# Patient Record
Sex: Male | Born: 1965 | Race: White | Hispanic: No | Marital: Single | Smoking: Never smoker
Health system: Southern US, Community
[De-identification: ages and names within clinical notes are randomized; demographics above are authoritative.]

## PROBLEM LIST (undated history)

## (undated) DIAGNOSIS — K7290 Hepatic failure, unspecified without coma: Secondary | ICD-10-CM

## (undated) DIAGNOSIS — K7010 Alcoholic hepatitis without ascites: Secondary | ICD-10-CM

## (undated) DIAGNOSIS — M109 Gout, unspecified: Secondary | ICD-10-CM

## (undated) DIAGNOSIS — K819 Cholecystitis, unspecified: Secondary | ICD-10-CM

## (undated) DIAGNOSIS — E871 Hypo-osmolality and hyponatremia: Secondary | ICD-10-CM

## (undated) DIAGNOSIS — F1020 Alcohol dependence, uncomplicated: Secondary | ICD-10-CM

## (undated) HISTORY — PX: APPENDECTOMY: SHX54

## (undated) HISTORY — PX: CHEST SURGERY: SHX595

---

## 2000-07-09 ENCOUNTER — Encounter: Payer: Self-pay | Admitting: Surgery

## 2000-07-09 ENCOUNTER — Inpatient Hospital Stay (HOSPITAL_COMMUNITY): Admission: EM | Admit: 2000-07-09 | Discharge: 2000-07-29 | Payer: Self-pay

## 2000-07-09 ENCOUNTER — Encounter: Payer: Self-pay | Admitting: Emergency Medicine

## 2000-07-10 ENCOUNTER — Encounter: Payer: Self-pay | Admitting: Surgery

## 2000-07-11 ENCOUNTER — Encounter: Payer: Self-pay | Admitting: General Surgery

## 2000-07-13 ENCOUNTER — Encounter: Payer: Self-pay | Admitting: General Surgery

## 2000-07-14 ENCOUNTER — Encounter: Payer: Self-pay | Admitting: General Surgery

## 2000-07-14 ENCOUNTER — Encounter: Payer: Self-pay | Admitting: Surgery

## 2000-07-16 ENCOUNTER — Encounter: Payer: Self-pay | Admitting: General Surgery

## 2000-07-17 ENCOUNTER — Encounter: Payer: Self-pay | Admitting: General Surgery

## 2000-07-18 ENCOUNTER — Encounter: Payer: Self-pay | Admitting: General Surgery

## 2000-07-19 ENCOUNTER — Encounter: Payer: Self-pay | Admitting: General Surgery

## 2000-07-20 ENCOUNTER — Encounter: Payer: Self-pay | Admitting: Cardiothoracic Surgery

## 2000-07-20 ENCOUNTER — Encounter: Payer: Self-pay | Admitting: General Surgery

## 2000-07-24 ENCOUNTER — Encounter: Payer: Self-pay | Admitting: Cardiothoracic Surgery

## 2000-07-25 ENCOUNTER — Encounter: Payer: Self-pay | Admitting: General Surgery

## 2000-07-26 ENCOUNTER — Encounter: Payer: Self-pay | Admitting: General Surgery

## 2000-07-27 ENCOUNTER — Encounter: Payer: Self-pay | Admitting: Thoracic Surgery (Cardiothoracic Vascular Surgery)

## 2000-07-27 ENCOUNTER — Encounter: Payer: Self-pay | Admitting: General Surgery

## 2000-08-15 ENCOUNTER — Encounter: Admission: RE | Admit: 2000-08-15 | Discharge: 2000-08-15 | Payer: Self-pay | Admitting: Cardiothoracic Surgery

## 2000-08-15 ENCOUNTER — Encounter: Payer: Self-pay | Admitting: Cardiothoracic Surgery

## 2001-02-17 ENCOUNTER — Emergency Department (HOSPITAL_COMMUNITY): Admission: EM | Admit: 2001-02-17 | Discharge: 2001-02-18 | Payer: Self-pay | Admitting: Emergency Medicine

## 2001-06-08 ENCOUNTER — Emergency Department (HOSPITAL_COMMUNITY): Admission: EM | Admit: 2001-06-08 | Discharge: 2001-06-08 | Payer: Self-pay

## 2002-12-30 ENCOUNTER — Emergency Department (HOSPITAL_COMMUNITY): Admission: EM | Admit: 2002-12-30 | Discharge: 2002-12-30 | Payer: Self-pay | Admitting: Emergency Medicine

## 2002-12-30 ENCOUNTER — Encounter: Payer: Self-pay | Admitting: Emergency Medicine

## 2002-12-30 ENCOUNTER — Ambulatory Visit (HOSPITAL_COMMUNITY): Admission: RE | Admit: 2002-12-30 | Discharge: 2002-12-30 | Payer: Self-pay | Admitting: Emergency Medicine

## 2004-04-20 ENCOUNTER — Emergency Department (HOSPITAL_COMMUNITY): Admission: EM | Admit: 2004-04-20 | Discharge: 2004-04-20 | Payer: Self-pay | Admitting: Emergency Medicine

## 2006-02-24 ENCOUNTER — Emergency Department (HOSPITAL_COMMUNITY): Admission: EM | Admit: 2006-02-24 | Discharge: 2006-02-24 | Payer: Self-pay | Admitting: Emergency Medicine

## 2006-06-09 ENCOUNTER — Emergency Department (HOSPITAL_COMMUNITY): Admission: EM | Admit: 2006-06-09 | Discharge: 2006-06-09 | Payer: Self-pay | Admitting: Emergency Medicine

## 2008-02-11 ENCOUNTER — Emergency Department (HOSPITAL_COMMUNITY): Admission: EM | Admit: 2008-02-11 | Discharge: 2008-02-11 | Payer: Self-pay | Admitting: Emergency Medicine

## 2008-07-30 ENCOUNTER — Emergency Department (HOSPITAL_COMMUNITY): Admission: EM | Admit: 2008-07-30 | Discharge: 2008-07-30 | Payer: Self-pay | Admitting: Emergency Medicine
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
## 2011-12-07 ENCOUNTER — Encounter (HOSPITAL_COMMUNITY): Payer: Self-pay | Admitting: Emergency Medicine

## 2011-12-07 ENCOUNTER — Emergency Department (HOSPITAL_COMMUNITY)
Admission: EM | Admit: 2011-12-07 | Discharge: 2011-12-08 | Disposition: A | Discharge status: Home / Self Care | Payer: Self-pay | Attending: Emergency Medicine | Admitting: Emergency Medicine

## 2011-12-07 ENCOUNTER — Emergency Department (HOSPITAL_COMMUNITY): Payer: Self-pay

## 2011-12-07 DIAGNOSIS — M109 Gout, unspecified: Secondary | ICD-10-CM | POA: Insufficient documentation

## 2011-12-07 DIAGNOSIS — M25579 Pain in unspecified ankle and joints of unspecified foot: Secondary | ICD-10-CM | POA: Insufficient documentation

## 2011-12-07 DIAGNOSIS — M7989 Other specified soft tissue disorders: Secondary | ICD-10-CM | POA: Insufficient documentation

## 2011-12-07 LAB — POCT I-STAT, CHEM 8
BUN: 11 mg/dL (ref 6–23)
Chloride: 108 mEq/L (ref 96–112)
Creatinine, Ser: 1.3 mg/dL (ref 0.50–1.35)
Glucose, Bld: 122 mg/dL — ABNORMAL HIGH (ref 70–99)
Hemoglobin: 16.7 g/dL (ref 13.0–17.0)
Potassium: 3.9 mEq/L (ref 3.5–5.1)
Sodium: 138 mEq/L (ref 135–145)

## 2011-12-07 MED ORDER — OXYCODONE-ACETAMINOPHEN 5-325 MG PO TABS
2.0000 | ORAL_TABLET | Freq: Once | ORAL | Status: AC
  Administered 2011-12-07: 2 via ORAL
  Filled 2011-12-07: qty 2
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
## 2011-12-07 NOTE — ED Notes (Signed)
Pt states he fell off steps on Friday.  Had knee pain for past 2 days.  States starting this am, having rt ankle pain.  No trauma noted to ankle.  States not taking any pain meds at home including ibuprofen/tylenol.  Pain is throbbing.  Rt ankle does appear swollen.

## 2011-12-07 NOTE — ED Notes (Signed)
Pt c/o R ankle pain, R knee pain yesterday. Denies trauma.

## 2011-12-08 MED ORDER — HYDROCODONE-ACETAMINOPHEN 5-325 MG PO TABS: 1.0000 | ORAL_TABLET | ORAL | Status: AC | PRN

## 2011-12-08 MED ORDER — COLCHICINE 0.6 MG PO TABS: 0.6000 mg | ORAL_TABLET | Freq: Every day | ORAL | Status: DC

## 2011-12-08 MED ORDER — INDOMETHACIN 25 MG PO CAPS: 25.0000 mg | ORAL_CAPSULE | Freq: Three times a day (TID) | ORAL | Status: AC | PRN

## 2011-12-08 NOTE — Discharge Instructions (Signed)
You were seen and evaluated today for your complaints of right ankle and foot pains. This time your x-rays do not show any broken bones or other concerning findings. Your lab tests to show signs concerning for possible gout flareup. He is given prescriptions for medicines to use for this. It is also recommended to use rest, ice, compression and elevation to reduce pain and swelling in your foot and ankle. Please followup to primary care provider for continued evaluation and treatment of your symptoms. Please read the attached information regarding your condition and dietary changes.  Gout Gout is an inflammatory condition (arthritis) caused by a buildup of uric acid crystals in the joints. Uric acid is a chemical that is normally present in the blood. Under some circumstances, uric acid can form into crystals in your joints. This causes joint redness, soreness, and swelling (inflammation). Repeat attacks are common. Over time, uric acid crystals can form into masses (tophi) near a joint, causing disfigurement. Gout is treatable and often preventable. CAUSES  The disease begins with elevated levels of uric acid in the blood. Uric acid is produced by your body when it breaks down a naturally found substance called purines. This also happens when you eat certain foods such as meats and fish. Causes of an elevated uric acid level include:  Being passed down from parent to child (heredity).   Diseases that cause increased uric acid production (obesity, psoriasis, some cancers).   Excessive alcohol use.   Diet, especially diets rich in meat and seafood.   Medicines, including certain cancer-fighting drugs (chemotherapy), diuretics, and aspirin.   Chronic kidney disease. The kidneys are no longer able to remove uric acid well.   Problems with metabolism.  Conditions strongly associated with gout include:  Obesity.   High blood pressure.   High cholesterol.   Diabetes.  Not everyone with  elevated uric acid levels gets gout. It is not understood why some people get gout and others do not. Surgery, joint injury, and eating too much of certain foods are some of the factors that can lead to gout. SYMPTOMS   An attack of gout comes on quickly. It causes intense pain with redness, swelling, and warmth in a joint.   Fever can occur.   Often, only one joint is involved. Certain joints are more commonly involved:   Base of the big toe.   Knee.   Ankle.   Wrist.   Finger.  Without treatment, an attack usually goes away in a few days to weeks. Between attacks, you usually will not have symptoms, which is different from many other forms of arthritis. DIAGNOSIS  Your caregiver will suspect gout based on your symptoms and exam. Removal of fluid from the joint (arthrocentesis) is done to check for uric acid crystals. Your caregiver will give you a medicine that numbs the area (local anesthetic) and use a needle to remove joint fluid for exam. Gout is confirmed when uric acid crystals are seen in joint fluid, using a special microscope. Sometimes, blood, urine, and X-ray tests are also used. TREATMENT  There are 2 phases to gout treatment: treating the sudden onset (acute) attack and preventing attacks (prophylaxis). Treatment of an Acute Attack  Medicines are used. These include anti-inflammatory medicines or steroid medicines.   An injection of steroid medicine into the affected joint is sometimes necessary.   The painful joint is rested. Movement can worsen the arthritis.   You may use warm or cold treatments on painful joints, depending which  works best for you.   Discuss the use of coffee, vitamin C, or cherries with your caregiver. These may be helpful treatment options.  Treatment to Prevent Attacks After the acute attack subsides, your caregiver may advise prophylactic medicine. These medicines either help your kidneys eliminate uric acid from your body or decrease your  uric acid production. You may need to stay on these medicines for a very long time. The early phase of treatment with prophylactic medicine can be associated with an increase in acute gout attacks. For this reason, during the first few months of treatment, your caregiver may also advise you to take medicines usually used for acute gout treatment. Be sure you understand your caregiver's directions. You should also discuss dietary treatment with your caregiver. Certain foods such as meats and fish can increase uric acid levels. Other foods such as dairy can decrease levels. Your caregiver can give you a list of foods to avoid. HOME CARE INSTRUCTIONS   Do not take aspirin to relieve pain. This raises uric acid levels.   Only take over-the-counter or prescription medicines for pain, discomfort, or fever as directed by your caregiver.   Rest the joint as much as possible. When in bed, keep sheets and blankets off painful areas.   Keep the affected joint raised (elevated).   Use crutches if the painful joint is in your leg.   Drink enough water and fluids to keep your urine clear or pale yellow. This helps your body get rid of uric acid. Do not drink alcoholic beverages. They slow the passage of uric acid.   Follow your caregiver's dietary instructions. Pay careful attention to the amount of protein you eat. Your daily diet should emphasize fruits, vegetables, whole grains, and fat-free or low-fat milk products.   Maintain a healthy body weight.  SEEK MEDICAL CARE IF:   You have an oral temperature above 102 F (38.9 C).   You develop diarrhea, vomiting, or any side effects from medicines.   You do not feel better in 24 hours, or you are getting worse.  SEEK IMMEDIATE MEDICAL CARE IF:   Your joint becomes suddenly more tender and you have:   Chills.   An oral temperature above 102 F (38.9 C), not controlled by medicine.  MAKE SURE YOU:   Understand these instructions.   Will watch  your condition.   Will get help right away if you are not doing well or get worse.  Document Released: 09/27/2000 Document Revised: 06/12/2011 Document Reviewed: 01/08/2010 [facility] Patient Information 2012 [place], [place].

## 2011-12-08 NOTE — ED Provider Notes (Signed)
Medical screening examination/treatment/procedure(s) were performed by non-physician practitioner and as supervising physician I was immediately available for consultation/collaboration.   Arnulfo Batson, MD 12/08/11 2059 

## 2012-09-10 ENCOUNTER — Emergency Department (HOSPITAL_COMMUNITY): Payer: Self-pay

## 2012-09-10 ENCOUNTER — Emergency Department (HOSPITAL_COMMUNITY)
Admission: EM | Admit: 2012-09-10 | Discharge: 2012-09-10 | Disposition: A | Discharge status: Home / Self Care | Payer: Self-pay | Attending: Emergency Medicine | Admitting: Emergency Medicine

## 2012-09-10 ENCOUNTER — Encounter (HOSPITAL_COMMUNITY): Payer: Self-pay | Admitting: *Deleted

## 2012-09-10 DIAGNOSIS — Z8639 Personal history of other endocrine, nutritional and metabolic disease: Secondary | ICD-10-CM | POA: Insufficient documentation

## 2012-09-10 DIAGNOSIS — M25529 Pain in unspecified elbow: Secondary | ICD-10-CM | POA: Insufficient documentation

## 2012-09-10 DIAGNOSIS — Z862 Personal history of diseases of the blood and blood-forming organs and certain disorders involving the immune mechanism: Secondary | ICD-10-CM | POA: Insufficient documentation

## 2012-09-10 HISTORY — DX: Gout, unspecified: M10.9

## 2012-09-10 MED ORDER — OXYCODONE-ACETAMINOPHEN 5-325 MG PO TABS
1.0000 | ORAL_TABLET | Freq: Once | ORAL | Status: AC
  Administered 2012-09-10: 1 via ORAL
  Filled 2012-09-10: qty 1

## 2012-09-10 MED ORDER — TRAMADOL HCL 50 MG PO TABS: 50.0000 mg | ORAL_TABLET | Freq: Four times a day (QID) | ORAL | Status: DC | PRN

## 2012-09-10 MED ORDER — IBUPROFEN 800 MG PO TABS: 800.0000 mg | ORAL_TABLET | Freq: Three times a day (TID) | ORAL | Status: DC

## 2012-09-10 NOTE — ED Notes (Signed)
Patient left arm pain that starts on his elbow to his fingers.  Patient has history of gout and thinks he may have gout on his arm.

## 2012-09-10 NOTE — ED Notes (Signed)
Ortho made aware  
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
## 2012-09-10 NOTE — Progress Notes (Signed)
Orthopedic Tech Progress Note Patient Details:  William [name] September 21, 1966 [id number]  Ortho Devices Type of Ortho Device: Arm foam sling Ortho Device/Splint Location: left arm Ortho Device/Splint Interventions: Application   Nikki Dom 09/10/2012, 7:41 PM

## 2014-04-12 ENCOUNTER — Emergency Department (HOSPITAL_COMMUNITY)
Admission: EM | Admit: 2014-04-12 | Discharge: 2014-04-12 | Disposition: A | Discharge status: Home / Self Care | Payer: Self-pay | Attending: Emergency Medicine | Admitting: Emergency Medicine

## 2014-04-12 ENCOUNTER — Encounter (HOSPITAL_COMMUNITY): Payer: Self-pay | Admitting: Emergency Medicine

## 2014-04-12 ENCOUNTER — Emergency Department (HOSPITAL_COMMUNITY): Payer: Self-pay

## 2014-04-12 DIAGNOSIS — M10022 Idiopathic gout, left elbow: Secondary | ICD-10-CM

## 2014-04-12 DIAGNOSIS — M109 Gout, unspecified: Secondary | ICD-10-CM | POA: Insufficient documentation

## 2014-04-12 MED ORDER — OXYCODONE-ACETAMINOPHEN 5-325 MG PO TABS
1.0000 | ORAL_TABLET | Freq: Once | ORAL | Status: AC
  Administered 2014-04-12: 1 via ORAL
  Filled 2014-04-12: qty 1

## 2014-04-12 MED ORDER — OXYCODONE-ACETAMINOPHEN 5-325 MG PO TABS: 1.0000 | ORAL_TABLET | ORAL | Status: DC | PRN

## 2014-04-12 MED ORDER — INDOMETHACIN 25 MG PO CAPS: 25.0000 mg | ORAL_CAPSULE | Freq: Three times a day (TID) | ORAL | Status: DC | PRN

## 2014-04-12 MED ORDER — PREDNISONE 50 MG PO TABS: ORAL_TABLET | ORAL | Status: DC

## 2014-04-12 MED ORDER — IBUPROFEN 400 MG PO TABS
800.0000 mg | ORAL_TABLET | Freq: Once | ORAL | Status: AC
  Administered 2014-04-12: 800 mg via ORAL
  Filled 2014-04-12: qty 2

## 2014-04-12 NOTE — ED Notes (Signed)
He c/o pain from L elbow to fingers x 3 days. States "it feels like my gout." he has no medication to take for pain at home. L elbow with redness and swelling

## 2014-04-12 NOTE — ED Notes (Signed)
C/O left elbow pain x 2 days. Left elbow is red and swollen. Hx of gout not on meds.

## 2014-04-12 NOTE — ED Provider Notes (Signed)
Medical screening examination/treatment/procedure(s) were performed by non-physician practitioner and as supervising physician I was immediately available for consultation/collaboration.   EKG Interpretation None       Martha K Linker, MD 04/12/14 1117 

## 2014-04-12 NOTE — Discharge Instructions (Signed)
Read the information below.  Use the prescribed medication as directed.  Please discuss all new medications with your pharmacist.  Do not take additional tylenol while taking the prescribed pain medication to avoid overdose.  You may return to the Emergency Department at any time for worsening condition or any new symptoms that concern you.  If you develop uncontrolled pain, weakness or numbness of the extremity, severe discoloration of the skin, or you are unable to move your fingers or use your arm, return to the ER for a recheck.     Gout Gout is an inflammatory arthritis caused by a buildup of uric acid crystals in the joints. Uric acid is a chemical that is normally present in the blood. When the level of uric acid in the blood is too high it can form crystals that deposit in your joints and tissues. This causes joint redness, soreness, and swelling (inflammation). Repeat attacks are common. Over time, uric acid crystals can form into masses (tophi) near a joint, destroying bone and causing disfigurement. Gout is treatable and often preventable. CAUSES  The disease begins with elevated levels of uric acid in the blood. Uric acid is produced by your body when it breaks down a naturally found substance called purines. Certain foods you eat, such as meats and fish, contain high amounts of purines. Causes of an elevated uric acid level include:  Being passed down from parent to child (heredity).  Diseases that cause increased uric acid production (such as obesity, psoriasis, and certain cancers).  Excessive alcohol use.  Diet, especially diets rich in meat and seafood.  Medicines, including certain cancer-fighting medicines (chemotherapy), water pills (diuretics), and aspirin.  Chronic kidney disease. The kidneys are no longer able to remove uric acid well.  Problems with metabolism. Conditions strongly associated with gout include:  Obesity.  High blood pressure.  High  cholesterol.  Diabetes. Not everyone with elevated uric acid levels gets gout. It is not understood why some people get gout and others do not. Surgery, joint injury, and eating too much of certain foods are some of the factors that can lead to gout attacks. SYMPTOMS   An attack of gout comes on quickly. It causes intense pain with redness, swelling, and warmth in a joint.  Fever can occur.  Often, only one joint is involved. Certain joints are more commonly involved:  Base of the big toe.  Knee.  Ankle.  Wrist.  Finger. Without treatment, an attack usually goes away in a few days to weeks. Between attacks, you usually will not have symptoms, which is different from many other forms of arthritis. DIAGNOSIS  Your caregiver will suspect gout based on your symptoms and exam. In some cases, tests may be recommended. The tests may include:  Blood tests.  Urine tests.  X-rays.  Joint fluid exam. This exam requires a needle to remove fluid from the joint (arthrocentesis). Using a microscope, gout is confirmed when uric acid crystals are seen in the joint fluid. TREATMENT  There are two phases to gout treatment: treating the sudden onset (acute) attack and preventing attacks (prophylaxis).  Treatment of an Acute Attack.  Medicines are used. These include anti-inflammatory medicines or steroid medicines.  An injection of steroid medicine into the affected joint is sometimes necessary.  The painful joint is rested. Movement can worsen the arthritis.  You may use warm or cold treatments on painful joints, depending which works best for you.  Treatment to Prevent Attacks.  If you suffer from  frequent gout attacks, your caregiver may advise preventive medicine. These medicines are started after the acute attack subsides. These medicines either help your kidneys eliminate uric acid from your body or decrease your uric acid production. You may need to stay on these medicines for a  very long time.  The early phase of treatment with preventive medicine can be associated with an increase in acute gout attacks. For this reason, during the first few months of treatment, your caregiver may also advise you to take medicines usually used for acute gout treatment. Be sure you understand your caregiver's directions. Your caregiver may make several adjustments to your medicine dose before these medicines are effective.  Discuss dietary treatment with your caregiver or dietitian. Alcohol and drinks high in sugar and fructose and foods such as meat, poultry, and seafood can increase uric acid levels. Your caregiver or dietician can advise you on drinks and foods that should be limited. HOME CARE INSTRUCTIONS   Do not take aspirin to relieve pain. This raises uric acid levels.  Only take over-the-counter or prescription medicines for pain, discomfort, or fever as directed by your caregiver.  Rest the joint as much as possible. When in bed, keep sheets and blankets off painful areas.  Keep the affected joint raised (elevated).  Apply warm or cold treatments to painful joints. Use of warm or cold treatments depends on which works best for you.  Use crutches if the painful joint is in your leg.  Drink enough fluids to keep your urine clear or pale yellow. This helps your body get rid of uric acid. Limit alcohol, sugary drinks, and fructose drinks.  Follow your dietary instructions. Pay careful attention to the amount of protein you eat. Your daily diet should emphasize fruits, vegetables, whole grains, and fat-free or low-fat milk products. Discuss the use of coffee, vitamin C, and cherries with your caregiver or dietician. These may be helpful in lowering uric acid levels.  Maintain a healthy body weight. SEEK MEDICAL CARE IF:   You develop diarrhea, vomiting, or any side effects from medicines.  You do not feel better in 24 hours, or you are getting worse. SEEK IMMEDIATE MEDICAL  CARE IF:   Your joint becomes suddenly more tender, and you have chills or a fever. MAKE SURE YOU:   Understand these instructions.  Will watch your condition.  Will get help right away if you are not doing well or get worse. Document Released: 09/27/2000 Document Revised: 01/25/2013 Document Reviewed: 05/13/2012 [facility] Patient Information 2015 [place], [place]. This information is not intended to replace advice given to you by your health care provider. Make sure you discuss any questions you have with your health care provider.
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
## 2015-01-26 ENCOUNTER — Emergency Department (HOSPITAL_COMMUNITY): Payer: Self-pay

## 2015-01-26 ENCOUNTER — Emergency Department (HOSPITAL_COMMUNITY)
Admission: EM | Admit: 2015-01-26 | Discharge: 2015-01-26 | Disposition: A | Discharge status: Home / Self Care | Payer: Self-pay | Attending: Emergency Medicine | Admitting: Emergency Medicine

## 2015-01-26 ENCOUNTER — Encounter (HOSPITAL_COMMUNITY): Payer: Self-pay | Admitting: Emergency Medicine

## 2015-01-26 DIAGNOSIS — R5383 Other fatigue: Secondary | ICD-10-CM | POA: Insufficient documentation

## 2015-01-26 DIAGNOSIS — R0789 Other chest pain: Secondary | ICD-10-CM | POA: Insufficient documentation

## 2015-01-26 DIAGNOSIS — R066 Hiccough: Secondary | ICD-10-CM | POA: Insufficient documentation

## 2015-01-26 DIAGNOSIS — Z8739 Personal history of other diseases of the musculoskeletal system and connective tissue: Secondary | ICD-10-CM | POA: Insufficient documentation

## 2015-01-26 LAB — TROPONIN I
TROPONIN I: 0.03 ng/mL (ref <0.031)
Troponin I: <0.03 ng/mL (ref <0.031)

## 2015-01-26 LAB — CBC WITH DIFFERENTIAL/PLATELET
BASOS ABS: 0 10*3/uL (ref 0.0–0.1)
Basophils Relative: 0 % (ref 0–1)
EOS ABS: 0 10*3/uL (ref 0.0–0.7)
EOS PCT: 0 % (ref 0–5)
HEMATOCRIT: 42.6 % (ref 39.0–52.0)
HEMOGLOBIN: 14.3 g/dL (ref 13.0–17.0)
Lymphocytes Relative: 23 % (ref 12–46)
Lymphs Abs: 1.1 10*3/uL (ref 0.7–4.0)
MCH: 32.3 pg (ref 26.0–34.0)
MCHC: 33.6 g/dL (ref 30.0–36.0)
MCV: 96.2 fL (ref 78.0–100.0)
MONO ABS: 0.6 10*3/uL (ref 0.1–1.0)
MONOS PCT: 12 % (ref 3–12)
NEUTROS ABS: 3 10*3/uL (ref 1.7–7.7)
Neutrophils Relative %: 65 % (ref 43–77)
Platelets: 121 10*3/uL — ABNORMAL LOW (ref 150–400)
RBC: 4.43 MIL/uL (ref 4.22–5.81)
RDW: 13.3 % (ref 11.5–15.5)
WBC: 4.6 10*3/uL (ref 4.0–10.5)

## 2015-01-26 LAB — COMPREHENSIVE METABOLIC PANEL
ALBUMIN: 3.7 g/dL (ref 3.5–5.2)
ALT: 89 U/L — ABNORMAL HIGH (ref 0–53)
ANION GAP: 13 (ref 5–15)
AST: 137 U/L — ABNORMAL HIGH (ref 0–37)
Alkaline Phosphatase: 77 U/L (ref 39–117)
BILIRUBIN TOTAL: 1.1 mg/dL (ref 0.3–1.2)
BUN: 18 mg/dL (ref 6–23)
CO2: 25 mmol/L (ref 19–32)
CREATININE: 1.51 mg/dL — AB (ref 0.50–1.35)
Calcium: 9.2 mg/dL (ref 8.4–10.5)
Chloride: 99 mmol/L (ref 96–112)
GFR calc Af Amer: 61 mL/min — ABNORMAL LOW (ref >90)
GFR, EST NON AFRICAN AMERICAN: 53 mL/min — AB (ref >90)
Glucose, Bld: 91 mg/dL (ref 70–99)
Potassium: 3.8 mmol/L (ref 3.5–5.1)
Sodium: 137 mmol/L (ref 135–145)
TOTAL PROTEIN: 7.4 g/dL (ref 6.0–8.3)

## 2015-01-26 LAB — URINALYSIS, ROUTINE W REFLEX MICROSCOPIC
BILIRUBIN URINE: NEGATIVE
Glucose, UA: NEGATIVE mg/dL
Hgb urine dipstick: NEGATIVE
KETONES UR: 15 mg/dL — AB
Leukocytes, UA: NEGATIVE
NITRITE: NEGATIVE
PROTEIN: NEGATIVE mg/dL
SPECIFIC GRAVITY, URINE: 1.022 (ref 1.005–1.030)
UROBILINOGEN UA: 0.2 mg/dL (ref 0.0–1.0)
pH: 5 (ref 5.0–8.0)

## 2015-01-26 LAB — ETHANOL: Alcohol, Ethyl (B): <5 mg/dL (ref 0–9)

## 2015-01-26 LAB — ACETAMINOPHEN LEVEL

## 2015-01-26 LAB — LIPASE, BLOOD: Lipase: 26 U/L (ref 11–59)

## 2015-01-26 MED ORDER — SODIUM CHLORIDE 0.9 % IV BOLUS (SEPSIS)
1000.0000 mL | Freq: Once | INTRAVENOUS | Status: AC
  Administered 2015-01-26: 1000 mL via INTRAVENOUS

## 2015-01-26 MED ORDER — SODIUM CHLORIDE 0.9 % IV SOLN
25.0000 mg | Freq: Once | INTRAVENOUS | Status: AC
  Administered 2015-01-26: 25 mg via INTRAVENOUS
  Filled 2015-01-26: qty 1

## 2015-01-26 MED ORDER — GI COCKTAIL ~~LOC~~
30.0000 mL | Freq: Once | ORAL | Status: AC
  Administered 2015-01-26: 30 mL via ORAL
  Filled 2015-01-26: qty 30

## 2015-01-26 MED ORDER — OMEPRAZOLE 20 MG PO CPDR: 20.0000 mg | DELAYED_RELEASE_CAPSULE | Freq: Every day | ORAL | Status: DC

## 2015-01-26 MED ORDER — ONDANSETRON HCL 4 MG PO TABS: 4.0000 mg | ORAL_TABLET | Freq: Four times a day (QID) | ORAL | Status: DC

## 2015-01-26 MED ORDER — CHLORPROMAZINE HCL 25 MG PO TABS
25.0000 mg | ORAL_TABLET | Freq: Once | ORAL | Status: AC
  Administered 2015-01-26: 25 mg via ORAL
  Filled 2015-01-26: qty 1

## 2015-01-26 NOTE — ED Notes (Signed)
Patient transported to X-ray 
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
## 2015-01-26 NOTE — ED Notes (Signed)
Pt given a sprite 

## 2015-01-26 NOTE — Discharge Instructions (Signed)
Hiccups Cut back on her alcohol use. Follow-up with the wellness Center. Return to the ED if you develop new or worsening symptoms A hiccup is the result of a sudden shortening of the muscle below your lungs (diaphragm). This movement of your diaphragm is then followed by the closing of your vocal cords, which causes the hiccup sound. Most people get the hiccups. Typically, hiccups last only a short amount of time. There are three types of hiccups:   Benign: last less than 48 hours.  Persistent: last more than 48 hours, but less than 1 month.  Intractable: last more than 1 month. A hiccup is a reflex. You cannot control reflexes. CAUSES  Causes of the hiccups can include:   Eating too much.  Drinking too much alcohol or fizzy drinks.  Eating too fast.  Eating or drinking hot and spicy foods or drinks.  Using certain medicines that have hiccupping as a side effect. Several medical conditions may also cause hiccups, including, but not limited to:  Stroke.  Gastroesophageal reflux.  Multiple sclerosis.  Traumatic brain injury.  Brain tumor.  Meningitis.  Having damage to the nerve that affects the diaphragm. Usually, though, hiccups have no apparent cause and are not the result of a serious medical condition. DIAGNOSIS  Tests may be performed to diagnose a possible condition associated with persistent or intractable hiccups. TREATMENT  Most cases of the hiccups need no treatment. None of the numerous home remedies have been proven to be effective. If your hiccups do require treatment, your treatment may include:  Medicine. Medicine may be given intravenously (by IV) or by mouth.  Hypnosis or acupuncture.  Surgery to the nerve that affects the diaphragm may be tried in severe cases. If your hiccups are caused by an underlying medical condition, treatment for the medical condition may be necessary.  HOME CARE INSTRUCTIONS   Eat small meals.  Limit alcohol intake to no  more than 1 drink per day for nonpregnant women and 2 drinks per day for men. One drink equals 12 ounces of beer, 5 ounces of wine, or 1 ounces of hard liquor.  Limit drinking fizzy drinks.  Eat and chew your food slowly.  Take medicines only as directed by your health care provider. SEEK MEDICAL CARE IF:   Your hiccups last for more than 48 hours.  You are given medicine, but your hiccups do not get better.  You cannot sleep or eat due to the hiccups.  You have unexpected weight loss due to the hiccups.  You have trouble breathing or swallowing.  You have a fever.  You develop severe pain in your abdomen.  You develop numbness, tingling, or weakness. Document Released: 12/09/2001 Document Revised: 02/14/2014 Document Reviewed: 11/21/2010 [facility] Patient Information 2015 [place]Care, [place]. This information is not intended to replace advice given to you by your health care provider. Make sure you discuss any questions you have with your health care provider.

## 2015-01-26 NOTE — ED Notes (Signed)
Pt has history of hiccups. He has had hiccups since 4-7 associated with chest pain 4-13.He denies any dizzy spells, SOB, blackouts.  Pt had diarrhea and vomiting up but no products has came up.He has no appetite.

## 2015-04-18 ENCOUNTER — Emergency Department (HOSPITAL_COMMUNITY)
Admission: EM | Admit: 2015-04-18 | Discharge: 2015-04-18 | Disposition: A | Discharge status: Home / Self Care | Payer: Self-pay | Attending: Emergency Medicine | Admitting: Emergency Medicine

## 2015-04-18 ENCOUNTER — Encounter (HOSPITAL_COMMUNITY): Payer: Self-pay

## 2015-04-18 ENCOUNTER — Emergency Department (HOSPITAL_COMMUNITY): Payer: Self-pay

## 2015-04-18 DIAGNOSIS — Z7952 Long term (current) use of systemic steroids: Secondary | ICD-10-CM | POA: Insufficient documentation

## 2015-04-18 DIAGNOSIS — M25422 Effusion, left elbow: Secondary | ICD-10-CM | POA: Insufficient documentation

## 2015-04-18 DIAGNOSIS — Z79899 Other long term (current) drug therapy: Secondary | ICD-10-CM | POA: Insufficient documentation

## 2015-04-18 DIAGNOSIS — M109 Gout, unspecified: Secondary | ICD-10-CM | POA: Insufficient documentation

## 2015-04-18 MED ORDER — OXYCODONE-ACETAMINOPHEN 5-325 MG PO TABS
1.0000 | ORAL_TABLET | Freq: Once | ORAL | Status: AC
  Administered 2015-04-18: 1 via ORAL
  Filled 2015-04-18: qty 1

## 2015-04-18 MED ORDER — HYDROCODONE-ACETAMINOPHEN 5-325 MG PO TABS: 1.0000 | ORAL_TABLET | Freq: Four times a day (QID) | ORAL | Status: DC | PRN

## 2015-04-18 MED ORDER — PREDNISONE 50 MG PO TABS: 50.0000 mg | ORAL_TABLET | Freq: Every day | ORAL | Status: DC

## 2015-04-18 MED ORDER — KETOROLAC TROMETHAMINE 60 MG/2ML IM SOLN
60.0000 mg | Freq: Once | INTRAMUSCULAR | Status: AC
  Administered 2015-04-18: 60 mg via INTRAMUSCULAR
  Filled 2015-04-18: qty 2
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
## 2015-04-18 NOTE — Discharge Instructions (Signed)
Return here as needed.  Follow-up with the clinic provided.  °

## 2015-04-18 NOTE — ED Notes (Signed)
Pt c/o L elbow/forearm pain x 1 day.  Pain score 10/10.  Denies injury.  Pt reports that a BC w/o relief.  Pt sts "I think, it's my gout."  Pt reports running out of gout medication and doesn't have a PCP.  Denies shoulder, back, and chest pain.

## 2015-04-29 ENCOUNTER — Emergency Department (HOSPITAL_COMMUNITY)
Admission: EM | Admit: 2015-04-29 | Discharge: 2015-04-29 | Disposition: A | Discharge status: Home / Self Care | Payer: Self-pay | Attending: Emergency Medicine | Admitting: Emergency Medicine

## 2015-04-29 ENCOUNTER — Encounter (HOSPITAL_COMMUNITY): Payer: Self-pay | Admitting: Emergency Medicine

## 2015-04-29 DIAGNOSIS — Z7952 Long term (current) use of systemic steroids: Secondary | ICD-10-CM | POA: Insufficient documentation

## 2015-04-29 DIAGNOSIS — M109 Gout, unspecified: Secondary | ICD-10-CM | POA: Insufficient documentation

## 2015-04-29 MED ORDER — COLCHICINE 0.6 MG PO TABS
0.6000 mg | ORAL_TABLET | Freq: Once | ORAL | Status: AC
  Administered 2015-04-29: 0.6 mg via ORAL
  Filled 2015-04-29: qty 1

## 2015-04-29 MED ORDER — COLCHICINE 0.6 MG PO TABS: 0.6000 mg | ORAL_TABLET | Freq: Every day | ORAL | Status: DC

## 2015-04-29 MED ORDER — OXYCODONE-ACETAMINOPHEN 5-325 MG PO TABS
1.0000 | ORAL_TABLET | Freq: Once | ORAL | Status: AC
  Administered 2015-04-29: 1 via ORAL
  Filled 2015-04-29: qty 1

## 2015-04-29 MED ORDER — OXYCODONE-ACETAMINOPHEN 5-325 MG PO TABS: 1.0000 | ORAL_TABLET | ORAL | Status: DC | PRN

## 2015-04-29 NOTE — ED Notes (Signed)
Pt c/o right arm/shoulder pain x couple days. Pt reports pain radiated from right elbow to right shoulder. Pt denies recent injury. Area painful to touch. Pt has history of gout and reports pain is like his gout pain.

## 2015-04-29 NOTE — Discharge Instructions (Signed)

## 2015-04-29 NOTE — Progress Notes (Signed)
49 y.o. M seen in the ED for Gout. CM spoke with pt who [place].  CM discussed and provided written information for self pay pcps, discussed the importance of pcp vs EDP services for f/u care, www.needymeds.org, www.goodrx.com, discounted pharmacies and other [place] resources such as [facility] , [facility], affordable care act,  [place] med assist, financial assistance, self pay dental services, [place] med assist, DSS and  health department  Reviewed resources for [place] self [place] pcps like [facility], family medicine [facility] [address], [facility], [facility], local urgent care centers, [facility], [facility] family practice, general medical clinics, [facility] services of the [place], [facility] urgent care plus others, medication resources, [facility] out patient pharmacies and housing Pt voiced understanding and appreciation of resources provided   Provided [facility] contact information Pt agreed to a referral Cm completed referral Pt to be contact by [facility] clinical liaison
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
## 2015-04-29 NOTE — ED Notes (Signed)
Pt undressed, in gown, on continuous pulse oximetry and blood pressure cuff 

## 2015-05-25 ENCOUNTER — Encounter (HOSPITAL_COMMUNITY): Payer: Self-pay

## 2015-05-25 ENCOUNTER — Emergency Department (HOSPITAL_COMMUNITY)
Admission: EM | Admit: 2015-05-25 | Discharge: 2015-05-25 | Disposition: A | Discharge status: Home / Self Care | Payer: Self-pay | Attending: Emergency Medicine | Admitting: Emergency Medicine

## 2015-05-25 DIAGNOSIS — Z720 Tobacco use: Secondary | ICD-10-CM | POA: Insufficient documentation

## 2015-05-25 DIAGNOSIS — G8929 Other chronic pain: Secondary | ICD-10-CM | POA: Insufficient documentation

## 2015-05-25 DIAGNOSIS — M109 Gout, unspecified: Secondary | ICD-10-CM | POA: Insufficient documentation

## 2015-05-25 MED ORDER — NAPROXEN 500 MG PO TABS: 500.0000 mg | ORAL_TABLET | Freq: Two times a day (BID) | ORAL | Status: DC

## 2015-05-25 MED ORDER — OXYCODONE-ACETAMINOPHEN 5-325 MG PO TABS
2.0000 | ORAL_TABLET | Freq: Once | ORAL | Status: AC
  Administered 2015-05-25: 2 via ORAL
  Filled 2015-05-25: qty 2

## 2015-05-25 MED ORDER — PREDNISONE 20 MG PO TABS: 40.0000 mg | ORAL_TABLET | Freq: Every day | ORAL | Status: DC

## 2015-05-25 NOTE — ED Notes (Signed)
Pt c/o chronic bilateral foot pain r/t gout.  Pain score 10/10.  Pt was seen at [facility] last month for same.  Pt has not followed up as directed.

## 2015-05-25 NOTE — ED Notes (Signed)
Pt reports that he got his percocet filled, but could not afford gout medication.

## 2015-05-25 NOTE — Discharge Instructions (Signed)
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
## 2015-05-30 ENCOUNTER — Inpatient Hospital Stay: Payer: Self-pay | Admitting: Family Medicine

## 2015-07-24 ENCOUNTER — Encounter (HOSPITAL_COMMUNITY): Payer: Self-pay | Admitting: Emergency Medicine

## 2015-07-24 ENCOUNTER — Emergency Department (HOSPITAL_COMMUNITY)
Admission: EM | Admit: 2015-07-24 | Discharge: 2015-07-24 | Disposition: A | Discharge status: Home / Self Care | Payer: Self-pay | Attending: Emergency Medicine | Admitting: Emergency Medicine

## 2015-07-24 ENCOUNTER — Telehealth: Payer: Self-pay | Admitting: General Practice

## 2015-07-24 DIAGNOSIS — M10021 Idiopathic gout, right elbow: Secondary | ICD-10-CM | POA: Insufficient documentation

## 2015-07-24 DIAGNOSIS — Z720 Tobacco use: Secondary | ICD-10-CM | POA: Insufficient documentation

## 2015-07-24 DIAGNOSIS — Z7952 Long term (current) use of systemic steroids: Secondary | ICD-10-CM | POA: Insufficient documentation

## 2015-07-24 MED ORDER — INDOMETHACIN 25 MG PO CAPS: 25.0000 mg | ORAL_CAPSULE | Freq: Three times a day (TID) | ORAL | Status: DC | PRN

## 2015-07-24 MED ORDER — HYDROCODONE-ACETAMINOPHEN 5-325 MG PO TABS: 1.0000 | ORAL_TABLET | Freq: Four times a day (QID) | ORAL | Status: DC | PRN

## 2015-07-24 NOTE — ED Notes (Signed)
Pt sts right elbow pain from gout x 3 days; pt sts missed appointment at PCP so has no meds

## 2015-07-24 NOTE — Telephone Encounter (Signed)
Patient came in requesting a medication refill for Gout Medicine. Please follow up.
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
## 2015-07-24 NOTE — Discharge Instructions (Signed)

## 2017-01-03 ENCOUNTER — Emergency Department (HOSPITAL_COMMUNITY)
Admission: EM | Admit: 2017-01-03 | Discharge: 2017-01-03 | Disposition: A | Discharge status: Home / Self Care | Payer: Self-pay | Attending: Emergency Medicine | Admitting: Emergency Medicine

## 2017-01-03 ENCOUNTER — Encounter (HOSPITAL_COMMUNITY): Payer: Self-pay | Admitting: Emergency Medicine

## 2017-01-03 DIAGNOSIS — K2970 Gastritis, unspecified, without bleeding: Secondary | ICD-10-CM | POA: Insufficient documentation

## 2017-01-03 DIAGNOSIS — R7989 Other specified abnormal findings of blood chemistry: Secondary | ICD-10-CM

## 2017-01-03 DIAGNOSIS — F17200 Nicotine dependence, unspecified, uncomplicated: Secondary | ICD-10-CM | POA: Insufficient documentation

## 2017-01-03 DIAGNOSIS — M109 Gout, unspecified: Secondary | ICD-10-CM | POA: Insufficient documentation

## 2017-01-03 DIAGNOSIS — R945 Abnormal results of liver function studies: Secondary | ICD-10-CM | POA: Insufficient documentation

## 2017-01-03 DIAGNOSIS — Z79899 Other long term (current) drug therapy: Secondary | ICD-10-CM | POA: Insufficient documentation

## 2017-01-03 DIAGNOSIS — Z7982 Long term (current) use of aspirin: Secondary | ICD-10-CM | POA: Insufficient documentation

## 2017-01-03 LAB — URINALYSIS, ROUTINE W REFLEX MICROSCOPIC
Glucose, UA: NEGATIVE mg/dL
HGB URINE DIPSTICK: NEGATIVE
Ketones, ur: NEGATIVE mg/dL
Leukocytes, UA: NEGATIVE
Nitrite: NEGATIVE
PROTEIN: NEGATIVE mg/dL
Specific Gravity, Urine: 1.029 (ref 1.005–1.030)
pH: 5 (ref 5.0–8.0)

## 2017-01-03 LAB — COMPREHENSIVE METABOLIC PANEL
ALT: 151 U/L — ABNORMAL HIGH (ref 17–63)
ANION GAP: 9 (ref 5–15)
AST: 146 U/L — AB (ref 15–41)
Albumin: 3.3 g/dL — ABNORMAL LOW (ref 3.5–5.0)
Alkaline Phosphatase: 136 U/L — ABNORMAL HIGH (ref 38–126)
BILIRUBIN TOTAL: 3.2 mg/dL — AB (ref 0.3–1.2)
BUN: 16 mg/dL (ref 6–20)
CHLORIDE: 106 mmol/L (ref 101–111)
CO2: 25 mmol/L (ref 22–32)
Calcium: 10.1 mg/dL (ref 8.9–10.3)
Creatinine, Ser: 1.42 mg/dL — ABNORMAL HIGH (ref 0.61–1.24)
GFR calc non Af Amer: 56 mL/min — ABNORMAL LOW (ref >60)
Glucose, Bld: 114 mg/dL — ABNORMAL HIGH (ref 65–99)
Potassium: 5.7 mmol/L — ABNORMAL HIGH (ref 3.5–5.1)
Sodium: 140 mmol/L (ref 135–145)
TOTAL PROTEIN: 8.1 g/dL (ref 6.5–8.1)

## 2017-01-03 LAB — CBC WITH DIFFERENTIAL/PLATELET
BASOS ABS: 0 10*3/uL (ref 0.0–0.1)
Basophils Relative: 0 %
EOS PCT: 2 %
Eosinophils Absolute: 0.2 10*3/uL (ref 0.0–0.7)
HEMATOCRIT: 47.1 % (ref 39.0–52.0)
Hemoglobin: 15.8 g/dL (ref 13.0–17.0)
Lymphocytes Relative: 20 %
Lymphs Abs: 1.5 10*3/uL (ref 0.7–4.0)
MCH: 32.3 pg (ref 26.0–34.0)
MCHC: 33.5 g/dL (ref 30.0–36.0)
MCV: 96.3 fL (ref 78.0–100.0)
Monocytes Absolute: 0.9 10*3/uL (ref 0.1–1.0)
Monocytes Relative: 13 %
Neutro Abs: 4.5 10*3/uL (ref 1.7–7.7)
Neutrophils Relative %: 65 %
Platelets: 209 10*3/uL (ref 150–400)
RBC: 4.89 MIL/uL (ref 4.22–5.81)
RDW: 15 % (ref 11.5–15.5)
WBC: 7.1 10*3/uL (ref 4.0–10.5)

## 2017-01-03 LAB — POTASSIUM: POTASSIUM: 4.4 mmol/L (ref 3.5–5.1)

## 2017-01-03 LAB — POC OCCULT BLOOD, ED: FECAL OCCULT BLD: NEGATIVE

## 2017-01-03 MED ORDER — PREDNISONE 20 MG PO TABS: ORAL_TABLET | ORAL | 0 refills | Status: DC

## 2017-01-03 MED ORDER — OXYCODONE HCL 5 MG PO TABS
5.0000 mg | ORAL_TABLET | Freq: Once | ORAL | Status: AC
  Administered 2017-01-03: 5 mg via ORAL
  Filled 2017-01-03: qty 1

## 2017-01-03 MED ORDER — OXYCODONE HCL 5 MG PO TABS: 5.0000 mg | ORAL_TABLET | ORAL | 0 refills | Status: DC | PRN

## 2017-01-03 MED ORDER — PREDNISONE 20 MG PO TABS
60.0000 mg | ORAL_TABLET | Freq: Once | ORAL | Status: AC
  Administered 2017-01-03: 60 mg via ORAL
  Filled 2017-01-03: qty 3

## 2017-01-03 MED ORDER — OMEPRAZOLE 20 MG PO CPDR: 20.0000 mg | DELAYED_RELEASE_CAPSULE | Freq: Every day | ORAL | 0 refills | Status: DC

## 2017-01-03 NOTE — ED Notes (Signed)
ED Provider at bedside. 

## 2017-01-03 NOTE — Discharge Instructions (Signed)
Take the prescribed medication as directed.  I recommend to avoid Goody or BC powder, ibuprofen, Aleve, or aspirin to avoid further stomach upset. You likely have a gastritis secondary to these medications.  Your liver enzymes and bilirubin were elevated today. This is something you need to monitor. Would try to avoid alcohol or Tylenol containing products as these are both process to your liver and can cause worsening lab values. You will need these rechecked with a primary care doctor. Follow-up with the [facility] and [facility] clinic-- you may call this afternoon to make an appointment. Return to the ED for new or worsening symptoms.
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
## 2017-01-03 NOTE — Progress Notes (Signed)
Orthopedic Tech Progress Note Patient Details:  William [name] 1966/01/01 [id number]  Ortho Devices Type of Ortho Device: Crutches, Knee Sleeve Ortho Device/Splint Location: rle Ortho Device/Splint Interventions: Application   William [name] 01/03/2017, 1:53 PM

## 2017-01-03 NOTE — Progress Notes (Signed)
Orthopedic Tech Progress Note Patient Details:  William [name] 11-30-1965 [id number]  Ortho Devices Type of Ortho Device: Knee Sleeve, Crutches Ortho Device/Splint Location: rle Ortho Device/Splint Interventions: Application   [name] 01/03/2017, 2:16 PM

## 2017-01-03 NOTE — ED Notes (Signed)
Pt now telling RN that he has been having blood in urine and stool for 2 days. Pt reports that it is not a lot and not every time he uses the bathroom. Denies dizziness.

## 2017-01-03 NOTE — ED Triage Notes (Signed)
Pt states his right leg has been hurting him for 2 days. Pt states he has gout and feels like it is a flare up

## 2017-01-17 ENCOUNTER — Emergency Department (HOSPITAL_COMMUNITY)
Admission: EM | Admit: 2017-01-17 | Discharge: 2017-01-17 | Disposition: A | Discharge status: Home / Self Care | Payer: Self-pay | Attending: Emergency Medicine | Admitting: Emergency Medicine

## 2017-01-17 ENCOUNTER — Other Ambulatory Visit: Payer: Self-pay

## 2017-01-17 ENCOUNTER — Encounter (HOSPITAL_COMMUNITY): Payer: Self-pay

## 2017-01-17 ENCOUNTER — Emergency Department (HOSPITAL_BASED_OUTPATIENT_CLINIC_OR_DEPARTMENT_OTHER)
Admit: 2017-01-17 | Discharge: 2017-01-17 | Disposition: A | Discharge status: Home / Self Care | Payer: Self-pay | Attending: Student | Admitting: Student

## 2017-01-17 ENCOUNTER — Emergency Department (HOSPITAL_COMMUNITY): Payer: Self-pay

## 2017-01-17 DIAGNOSIS — M25561 Pain in right knee: Secondary | ICD-10-CM

## 2017-01-17 DIAGNOSIS — F17200 Nicotine dependence, unspecified, uncomplicated: Secondary | ICD-10-CM | POA: Insufficient documentation

## 2017-01-17 DIAGNOSIS — Z7982 Long term (current) use of aspirin: Secondary | ICD-10-CM | POA: Insufficient documentation

## 2017-01-17 DIAGNOSIS — G8929 Other chronic pain: Secondary | ICD-10-CM | POA: Insufficient documentation

## 2017-01-17 MED ORDER — COLCHICINE 0.6 MG PO TABS: 1.2000 mg | ORAL_TABLET | Freq: Once | ORAL | Status: DC

## 2017-01-17 MED ORDER — COLCHICINE 0.6 MG PO TABS
1.2000 mg | ORAL_TABLET | Freq: Once | ORAL | Status: AC
  Administered 2017-01-17: 1.2 mg via ORAL
  Filled 2017-01-17: qty 2

## 2017-01-17 MED ORDER — COLCHICINE 0.6 MG PO TABS: 0.6000 mg | ORAL_TABLET | Freq: Every day | ORAL | 0 refills | Status: DC

## 2017-01-17 MED ORDER — HYDROCODONE-ACETAMINOPHEN 5-325 MG PO TABS
1.0000 | ORAL_TABLET | Freq: Once | ORAL | Status: AC
  Administered 2017-01-17: 1 via ORAL
  Filled 2017-01-17: qty 1

## 2017-01-17 MED ORDER — HYDROCODONE-ACETAMINOPHEN 5-325 MG PO TABS: 1.0000 | ORAL_TABLET | Freq: Four times a day (QID) | ORAL | 0 refills | Status: DC | PRN

## 2017-01-17 NOTE — ED Notes (Signed)
Pt given cokes

## 2017-01-17 NOTE — Progress Notes (Signed)
*  PRELIMINARY RESULTS* Vascular Ultrasound Right lower extremity venous duplex has been completed.  Preliminary findings: No evidence of deep vein thrombosis or baker's cysts in the right lower extremity.   Chauncey Fischer 01/17/2017, 10:47 AM
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
## 2017-01-17 NOTE — ED Notes (Signed)
Patient transported to X-ray 

## 2017-01-17 NOTE — Discharge Instructions (Signed)
Your ultrasound was negative for a blood clot. X-ray shows small amount of fluid. Please continue wearing the knee brace at home. May take the medication starting tomorrow. Please avoid alcohol. Follow-up with orthopedist. Have given a referral to a primary care doctor. If your knee becomes red or if he develops any fever please return to the ED immediately.

## 2017-01-17 NOTE — ED Triage Notes (Signed)
Pt states he has hx of gout. Has had this flare up X2 weeks. Right knee notably swollen. Pt states the last time he had gout he took colchecine with relief.

## 2017-01-17 NOTE — ED Notes (Signed)
Pt to vascular.

## 2017-01-30 ENCOUNTER — Encounter (HOSPITAL_COMMUNITY): Payer: Self-pay | Admitting: Emergency Medicine

## 2017-01-30 ENCOUNTER — Emergency Department (HOSPITAL_COMMUNITY)
Admission: EM | Admit: 2017-01-30 | Discharge: 2017-01-30 | Disposition: A | Discharge status: Home / Self Care | Payer: Self-pay | Attending: Physician Assistant | Admitting: Physician Assistant

## 2017-01-30 DIAGNOSIS — F17200 Nicotine dependence, unspecified, uncomplicated: Secondary | ICD-10-CM | POA: Insufficient documentation

## 2017-01-30 DIAGNOSIS — Z7982 Long term (current) use of aspirin: Secondary | ICD-10-CM | POA: Insufficient documentation

## 2017-01-30 DIAGNOSIS — M1A0610 Idiopathic chronic gout, right knee, without tophus (tophi): Secondary | ICD-10-CM

## 2017-01-30 DIAGNOSIS — M109 Gout, unspecified: Secondary | ICD-10-CM | POA: Insufficient documentation

## 2017-01-30 DIAGNOSIS — G8929 Other chronic pain: Secondary | ICD-10-CM | POA: Insufficient documentation

## 2017-01-30 DIAGNOSIS — Z79899 Other long term (current) drug therapy: Secondary | ICD-10-CM | POA: Insufficient documentation

## 2017-01-30 MED ORDER — COLCHICINE 0.6 MG PO TABS: ORAL_TABLET | ORAL | 0 refills | Status: DC

## 2017-01-30 MED ORDER — OXYCODONE-ACETAMINOPHEN 5-325 MG PO TABS: 1.0000 | ORAL_TABLET | ORAL | 0 refills | Status: DC | PRN

## 2017-01-30 MED ORDER — COLCHICINE 0.6 MG PO TABS
1.2000 mg | ORAL_TABLET | Freq: Once | ORAL | Status: AC
  Administered 2017-01-30: 1.2 mg via ORAL
  Filled 2017-01-30: qty 2

## 2017-01-30 MED ORDER — OXYCODONE-ACETAMINOPHEN 5-325 MG PO TABS
1.0000 | ORAL_TABLET | Freq: Once | ORAL | Status: AC
  Administered 2017-01-30: 1 via ORAL
  Filled 2017-01-30: qty 1

## 2017-01-30 MED FILL — COLCHICINE 0.6 MG TABLET: 0.6 | 6 days supply | Qty: 12 | Fill #0
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
## 2017-01-30 NOTE — Discharge Instructions (Signed)
Continue taking your medications as prescribed as needed for pain.  Follow up with the primary care provider listed below for follow up regarding your gout flare. I also recommend scheduling an appointment with the rheumatologist listed below for further management of your gout. Please return to the Emergency Department if symptoms worsen or new onset of fever, redness, worsening swelling, numbness, weakness, drainage.

## 2017-01-30 NOTE — ED Triage Notes (Signed)
Per EMS: pt from home c/o right knee and left elbow with hx of gout; right knee is swollen

## 2017-02-13 ENCOUNTER — Encounter: Payer: Self-pay | Admitting: Physician Assistant

## 2017-02-13 ENCOUNTER — Ambulatory Visit: Payer: Self-pay | Attending: Internal Medicine | Admitting: Physician Assistant

## 2017-02-13 VITALS — BP 119/75 | HR 104 | Temp 97.3°F | Resp 16 | Wt 187.4 lb

## 2017-02-13 DIAGNOSIS — E876 Hypokalemia: Secondary | ICD-10-CM | POA: Insufficient documentation

## 2017-02-13 DIAGNOSIS — M25561 Pain in right knee: Secondary | ICD-10-CM | POA: Insufficient documentation

## 2017-02-13 DIAGNOSIS — M1A9XX0 Chronic gout, unspecified, without tophus (tophi): Secondary | ICD-10-CM | POA: Insufficient documentation

## 2017-02-13 DIAGNOSIS — R945 Abnormal results of liver function studies: Secondary | ICD-10-CM

## 2017-02-13 DIAGNOSIS — Z7982 Long term (current) use of aspirin: Secondary | ICD-10-CM | POA: Insufficient documentation

## 2017-02-13 DIAGNOSIS — M1A0610 Idiopathic chronic gout, right knee, without tophus (tophi): Secondary | ICD-10-CM

## 2017-02-13 DIAGNOSIS — R7989 Other specified abnormal findings of blood chemistry: Secondary | ICD-10-CM

## 2017-02-13 DIAGNOSIS — M7989 Other specified soft tissue disorders: Secondary | ICD-10-CM | POA: Insufficient documentation

## 2017-02-13 MED ORDER — INDOMETHACIN 50 MG PO CAPS: 50.0000 mg | ORAL_CAPSULE | Freq: Two times a day (BID) | ORAL | 0 refills | Status: DC

## 2017-02-13 MED ORDER — COLCHICINE 0.6 MG PO TABS: ORAL_TABLET | ORAL | 0 refills | Status: DC

## 2017-02-13 MED FILL — COLCHICINE 0.6 MG TABLET: 0.6 | 10 days supply | Qty: 20 | Fill #0

## 2017-02-13 MED FILL — INDOMETHACIN 50 MG CAPSULE: 50 | 30 days supply | Qty: 60 | Fill #0

## 2017-02-13 NOTE — Progress Notes (Signed)
William [name], is a 51 y.o. male  ZOX:[id number]  [id number]:[id number]  DOB - 1966/01/04  Subjective:  Chief Complaint and HPI: William [name] is a 51 y.o. male here today to establish care and for a follow up visit after being seen in the ED multiple times over the last 6 weeks.  He does think he may have fallen about 6 weeks ago but doesn't recall the details of the fall. He has a h/o gout and this is presumed to be a gout flare.  Colchicine calms the pain down but doesn't make it go away.  Indomethacin and colchicine together have worked well in the past.  Denies h/o GI Bleed.  No longer drinking alcohol and he has been watching his diet and not eating red meats/cheeses.  Pain in R knee is causing him to have to use crutches for the last 6 weeks.  Normally ambulates without any device.  Pain is moderate to severe.  No f/c.  Denies other health problems.  He has never had definitive arthrocentesis of the joint or gout work up.  Last uric acid level was 2013.  ED/Hospital notes reviewed. Xray with degenerative changes and small joint effusion. From 01/30/2017 ED note: Pt presents with continued right knee pain and swelling consistent with hx of gout. Also reports mild pain and swelling to left elbow consistent with his story of gout. Chart Review shows that the patient's third visit to the ED for the same symptoms over the past month. Patient was initially seen on 3/28, was given pain meds and prednisone without improvement of symptoms. Patient was seen again on 4/6, right knee x-ray revealed tricompartmental arthritis, otherwise negative, LV venous study negative for DVT, patient was discharged home with one day prescription of colchicine and pain meds. Patient reports initial improvement of symptoms after taking prescription of colchicine but notes his symptoms returned this past week. Reports falling recently due to pain but denies any injury to his right knee. Vitals showed HR 108, otherwise stable. Pt given  pain meds. Exam revealed moderate swelling and diffuse tenderness to right knee without erythema or warmth. Decreased range of motion due to pain. Left elbow x-ray revealed mild tenderness and swelling with full range of motion. Bilateral upper and lower extremities otherwise neurovascularly intact. Discussed pt with Dr. Corlis Leak. Pt's presentations appears to be consistent with gout. Do not suspect septic joint and do not feel that any further imaging or joint aspiration is warranted at this time. Plan to place patient on longer prescription of colchicine and sent home with pain meds. I spoke with [facility] management he was able to set a PCP follow-up appointment for the patient for further management of his gout. Discussed results and plan for discharge with patient. Discussed return precautions  Social History: lives with roomates, brother is here with him today.  Doesn't read/write well  ROS:   Constitutional:  No f/c, No night sweats, No unexplained weight loss. EENT:  No vision changes, No blurry vision, No hearing changes. No mouth, throat, or ear problems.  Respiratory: No cough, No SOB Cardiac: No CP, no palpitations GI:  No abd pain, No N/V/D. GU: No Urinary s/sx Musculoskeletal: +R knee pain; some L elbow pain. Neuro: No headache, no dizziness, no motor weakness.  Skin: No rash Endocrine:  No polydipsia. No polyuria.  Psych: Denies SI/HI  No problems updated.  ALLERGIES: No Known Allergies  PAST MEDICAL HISTORY: Past Medical History:  Diagnosis Date  . Gout  MEDICATIONS AT HOME: Prior to Admission medications   Medication Sig Start Date End Date Taking? Authorizing Provider  Aspirin-Salicylamide-Caffeine (BC HEADACHE PO) Take 1 packet by mouth daily as needed (pain).    Historical Provider, MD  colchicine 0.6 MG tablet Take 0.6mg  (one tablet) by mouth twice daily until one of the following occurs: 1.  The pain is gone 2.  The side effects outweight the benefits Stop  taking the medication 2 days after your symptoms have completely resolved. 02/13/17   Anders SimmondsAngela M Zaira Iacovelli, PA-C  ibuprofen (ADVIL,MOTRIN) 200 MG tablet Take 200 mg by mouth every 6 (six) hours as needed for moderate pain.    Historical Provider, MD  indomethacin (INDOCIN) 50 MG capsule Take 1 capsule (50 mg total) by mouth 2 (two) times daily with a meal. Prn Until pain subsides 02/13/17   Marzella SchleinAngela M Lockie Bothun, PA-C  ondansetron (ZOFRAN) 4 MG tablet Take 1 tablet (4 mg total) by mouth every 6 (six) hours. Patient not taking: Reported on 04/18/2015 01/26/15   Glynn OctaveStephen Rancour, MD  oxyCODONE (OXY IR/ROXICODONE) 5 MG immediate release tablet Take 1 tablet (5 mg total) by mouth every 4 (four) hours as needed for severe pain. Patient not taking: Reported on 02/13/2017 01/03/17   Garlon HatchetLisa M Sanders, PA-C     Objective:  EXAM:   Vitals:   02/13/17 1424  BP: 119/75  Pulse: (!) 104  Resp: 16  Temp: 97.3 F (36.3 C)  TempSrc: Oral  SpO2: 99%  Weight: 187 lb 6.4 oz (85 kg)    General appearance : A&OX3. NAD. Non-toxic-appearing. Using crutches to ambulate. HEENT: Atraumatic and Normocephalic.  PERRLA. EOM intact.   Neck: supple, no JVD. No cervical lymphadenopathy. No thyromegaly Chest/Lungs:  Breathing-non-labored, Good air entry bilaterally, breath sounds normal without rales, rhonchi, or wheezing  CVS: S1 S2 regular, no murmurs, gallops, rubs  Extremities: Bilateral Lower Ext shows no edema, both legs are warm to touch with = pulse throughout.  R knee vs L examined.  There is no erythema.  Skin is warm dry and intact.  R knee is slightly larger than L with no definite effusion and no ballotment.  ROM ~50% of normal with flexion and extension.  Exam limited by pain.  Joint/ligaments seems stable. Neurology:  CN II-XII grossly intact, Non focal.   Skin:  No Rash  Data Review No results found for: HGBA1C   Assessment & Plan   1. Recurrent pain of right knee -patient is unsure, but?possible recent fall.   Presumed gout flare refractory to treatment/pain recurs as soon as colchicine is stopped.  Xray without any fracture.  No sign of infection today. - Uric Acid - CBC with Differential/Platelet - Sedimentation Rate - colchicine 0.6 MG tablet; Take 0.6mg  (one tablet) by mouth twice daily until one of the following occurs: 1.  The pain is gone 2.  The side effects outweight the benefits Stop taking the medication 2 days after your symptoms have completely resolved.  Dispense: 20 tablet; Refill: 0 - indomethacin (INDOCIN) 50 MG capsule; Take 1 capsule (50 mg total) by mouth 2 (two) times daily with a meal. Prn Until pain subsides  Dispense: 60 capsule; Refill: 0  2. Chronic gout of right knee, unspecified cause-presumed cause of pain - Uric Acid - CBC with Differential/Platelet - Sedimentation Rate - colchicine 0.6 MG tablet; Take 0.6mg  (one tablet) by mouth twice daily until one of the following occurs: 1.  The pain is gone 2.  The side effects outweight the  benefits Stop taking the medication 2 days after your symptoms have completely resolved.  Dispense: 20 tablet; Refill: 0 - indomethacin (INDOCIN) 50 MG capsule; Take 1 capsule (50 mg total) by mouth 2 (two) times daily with a meal. Prn Until pain subsides  Dispense: 60 capsule; Refill: 0  3.  Hypokalemia and elevate LFTs/abnormal CMP 12/2016. Recheck CMP today  Will refer to either rheumatology or ortho pending labs.  Patient is getting financial packet today.   Patient have been counseled extensively about nutrition and exercise  Return in about 3 weeks (around 03/06/2017) for assign PCP; f/up gout.  The patient was given clear instructions to go to ER or return to medical center if symptoms don't improve, worsen or new problems develop. The patient verbalized understanding. The patient was told to call to get lab results if they haven't heard anything in the next week.   Georgian Co, PA-C [facility]  [place], [place] [phone]   02/13/2017, 2:47 PMPatient ID: William [name], male   DOB: 25-Jul-1966, 51 y.o.   MRN: [id number]

## 2017-02-13 NOTE — Patient Instructions (Signed)
Gout Gout is painful swelling that can happen in some of your joints. Gout is a type of arthritis. This condition is caused by having too much uric acid in your body. Uric acid is a chemical that is made when your body breaks down substances called purines. If your body has too much uric acid, sharp crystals can form and build up in your joints. This causes pain and swelling. Gout attacks can happen quickly and be very painful (acute gout). Over time, the attacks can affect more joints and happen more often (chronic gout). Follow these instructions at home: During a Gout Attack   If directed, put ice on the painful area:  Put ice in a plastic bag.  Place a towel between your skin and the bag.  Leave the ice on for 20 minutes, 2-3 times a day.  Rest the joint as much as possible. If the joint is in your leg, you may be given crutches to use.  Raise (elevate) the painful joint above the level of your heart as often as you can.  Drink enough fluids to keep your pee (urine) clear or pale yellow.  Take over-the-counter and prescription medicines only as told by your doctor.  Do not drive or use heavy machinery while taking prescription pain medicine.  Follow instructions from your doctor about what you can or cannot eat and drink.  Return to your normal activities as told by your doctor. Ask your doctor what activities are safe for you. Avoiding Future Gout Attacks   Follow a low-purine diet as told by a specialist (dietitian) or your doctor. Avoid foods and drinks that have a lot of purines, such as:  Liver.  Kidney.  Anchovies.  Asparagus.  Herring.  Mushrooms  Mussels.  Beer.  Limit alcohol intake to no more than 1 drink a day for nonpregnant women and 2 drinks a day for men. One drink equals 12 oz of beer, 5 oz of wine, or 1 oz of hard liquor.  Stay at a healthy weight or lose weight if you are overweight. If you want to lose weight, talk with your doctor. It is  important that you do not lose weight too fast.  Start or continue an exercise plan as told by your doctor.  Drink enough fluids to keep your pee clear or pale yellow.  Take over-the-counter and prescription medicines only as told by your doctor.  Keep all follow-up visits as told by your doctor. This is important. Contact a doctor if:  You have another gout attack.  You still have symptoms of a gout attack after10 days of treatment.  You have problems (side effects) because of your medicines.  You have chills or a fever.  You have burning pain when you pee (urinate).  You have pain in your lower back or belly. Get help right away if:  You have very bad pain.  Your pain cannot be controlled.  You cannot pee. This information is not intended to replace advice given to you by your health care provider. Make sure you discuss any questions you have with your health care provider. Document Released: 07/09/2008 Document Revised: 03/07/2016 Document Reviewed: 07/13/2015 Elsevier Interactive Patient Education  2017 [employer]  

## 2017-02-14 ENCOUNTER — Telehealth: Payer: Self-pay

## 2017-02-14 LAB — CBC WITH DIFFERENTIAL/PLATELET
Basophils Absolute: 0.1 10*3/uL (ref 0.0–0.2)
Basos: 1 %
EOS (ABSOLUTE): 0.2 10*3/uL (ref 0.0–0.4)
Eos: 3 %
Hematocrit: 43.8 % (ref 37.5–51.0)
Hemoglobin: 14.2 g/dL (ref 13.0–17.7)
IMMATURE GRANULOCYTES: 0 %
Immature Grans (Abs): 0 10*3/uL (ref 0.0–0.1)
Lymphocytes Absolute: 2.3 10*3/uL (ref 0.7–3.1)
Lymphs: 29 %
MCH: 30 pg (ref 26.6–33.0)
MCHC: 32.4 g/dL (ref 31.5–35.7)
MCV: 93 fL (ref 79–97)
Monocytes Absolute: 0.8 10*3/uL (ref 0.1–0.9)
Monocytes: 11 %
NEUTROS PCT: 56 %
Neutrophils Absolute: 4.3 10*3/uL (ref 1.4–7.0)
Platelets: 285 10*3/uL (ref 150–379)
RBC: 4.73 x10E6/uL (ref 4.14–5.80)
RDW: 14.6 % (ref 12.3–15.4)
WBC: 7.7 10*3/uL (ref 3.4–10.8)

## 2017-02-14 LAB — URIC ACID: Uric Acid: 9.9 mg/dL — ABNORMAL HIGH (ref 3.7–8.6)

## 2017-02-14 LAB — SEDIMENTATION RATE: SED RATE: 13 mm/h (ref 0–30)

## 2017-02-14 NOTE — Telephone Encounter (Signed)
Contacted pt to go over lab results pt is aware of results and doesn't have any questions or concerns 

## 2017-03-11 ENCOUNTER — Ambulatory Visit: Payer: Self-pay | Admitting: Family Medicine

## 2017-03-14 DIAGNOSIS — K7010 Alcoholic hepatitis without ascites: Secondary | ICD-10-CM

## 2017-03-14 DIAGNOSIS — K7290 Hepatic failure, unspecified without coma: Secondary | ICD-10-CM

## 2017-03-14 HISTORY — DX: Hepatic failure, unspecified without coma: K72.90

## 2017-03-14 HISTORY — DX: Alcoholic hepatitis without ascites: K70.10

## 2017-03-21 ENCOUNTER — Other Ambulatory Visit: Payer: Self-pay | Admitting: Pharmacist

## 2017-03-21 DIAGNOSIS — M25561 Pain in right knee: Secondary | ICD-10-CM

## 2017-03-21 DIAGNOSIS — M1A0610 Idiopathic chronic gout, right knee, without tophus (tophi): Secondary | ICD-10-CM

## 2017-03-21 MED ORDER — COLCHICINE 0.6 MG PO TABS: ORAL_TABLET | ORAL | 1 refills | Status: DC

## 2017-03-21 MED FILL — COLCHICINE 0.6 MG TABLET: 0.6 | 10 days supply | Qty: 20 | Fill #0

## 2017-04-04 ENCOUNTER — Emergency Department (HOSPITAL_COMMUNITY): Payer: Self-pay

## 2017-04-04 ENCOUNTER — Observation Stay (HOSPITAL_COMMUNITY): Payer: Self-pay | Admitting: Certified Registered Nurse Anesthetist

## 2017-04-04 ENCOUNTER — Encounter (HOSPITAL_COMMUNITY)
Admission: EM | Disposition: A | Discharge status: Home / Self Care | Payer: Self-pay | Source: Home / Self Care | Attending: Internal Medicine

## 2017-04-04 ENCOUNTER — Inpatient Hospital Stay (HOSPITAL_COMMUNITY)
Admission: EM | Admit: 2017-04-04 | Discharge: 2017-04-06 | DRG: 444 | Disposition: A | Discharge status: Home / Self Care | Payer: Self-pay | Attending: Internal Medicine | Admitting: Internal Medicine

## 2017-04-04 ENCOUNTER — Encounter (HOSPITAL_COMMUNITY): Payer: Self-pay | Admitting: Emergency Medicine

## 2017-04-04 ENCOUNTER — Observation Stay (HOSPITAL_COMMUNITY): Payer: Self-pay

## 2017-04-04 DIAGNOSIS — K7010 Alcoholic hepatitis without ascites: Secondary | ICD-10-CM | POA: Diagnosis present

## 2017-04-04 DIAGNOSIS — K8020 Calculus of gallbladder without cholecystitis without obstruction: Secondary | ICD-10-CM | POA: Diagnosis present

## 2017-04-04 DIAGNOSIS — F1020 Alcohol dependence, uncomplicated: Secondary | ICD-10-CM

## 2017-04-04 DIAGNOSIS — R0789 Other chest pain: Secondary | ICD-10-CM

## 2017-04-04 DIAGNOSIS — K838 Other specified diseases of biliary tract: Secondary | ICD-10-CM

## 2017-04-04 DIAGNOSIS — K7200 Acute and subacute hepatic failure without coma: Secondary | ICD-10-CM | POA: Diagnosis present

## 2017-04-04 DIAGNOSIS — E871 Hypo-osmolality and hyponatremia: Secondary | ICD-10-CM | POA: Diagnosis present

## 2017-04-04 DIAGNOSIS — K8070 Calculus of gallbladder and bile duct without cholecystitis without obstruction: Principal | ICD-10-CM | POA: Diagnosis present

## 2017-04-04 DIAGNOSIS — R739 Hyperglycemia, unspecified: Secondary | ICD-10-CM | POA: Diagnosis present

## 2017-04-04 DIAGNOSIS — K7689 Other specified diseases of liver: Secondary | ICD-10-CM

## 2017-04-04 DIAGNOSIS — R945 Abnormal results of liver function studies: Secondary | ICD-10-CM

## 2017-04-04 DIAGNOSIS — M109 Gout, unspecified: Secondary | ICD-10-CM | POA: Diagnosis present

## 2017-04-04 DIAGNOSIS — K7290 Hepatic failure, unspecified without coma: Secondary | ICD-10-CM | POA: Diagnosis present

## 2017-04-04 DIAGNOSIS — R7989 Other specified abnormal findings of blood chemistry: Secondary | ICD-10-CM

## 2017-04-04 DIAGNOSIS — R17 Unspecified jaundice: Secondary | ICD-10-CM

## 2017-04-04 DIAGNOSIS — K8051 Calculus of bile duct without cholangitis or cholecystitis with obstruction: Secondary | ICD-10-CM

## 2017-04-04 DIAGNOSIS — Z8249 Family history of ischemic heart disease and other diseases of the circulatory system: Secondary | ICD-10-CM

## 2017-04-04 DIAGNOSIS — Z8739 Personal history of other diseases of the musculoskeletal system and connective tissue: Secondary | ICD-10-CM

## 2017-04-04 DIAGNOSIS — K8050 Calculus of bile duct without cholangitis or cholecystitis without obstruction: Secondary | ICD-10-CM | POA: Diagnosis present

## 2017-04-04 DIAGNOSIS — K8071 Calculus of gallbladder and bile duct without cholecystitis with obstruction: Secondary | ICD-10-CM

## 2017-04-04 DIAGNOSIS — R030 Elevated blood-pressure reading, without diagnosis of hypertension: Secondary | ICD-10-CM | POA: Diagnosis present

## 2017-04-04 HISTORY — DX: Hypo-osmolality and hyponatremia: E87.1

## 2017-04-04 HISTORY — DX: Alcoholic hepatitis without ascites: K70.10

## 2017-04-04 HISTORY — DX: Alcohol dependence, uncomplicated: F10.20

## 2017-04-04 HISTORY — DX: Hepatic failure, unspecified without coma: K72.90

## 2017-04-04 HISTORY — DX: Cholecystitis, unspecified: K81.9

## 2017-04-04 HISTORY — PX: ERCP: SHX5425

## 2017-04-04 LAB — URINALYSIS, ROUTINE W REFLEX MICROSCOPIC
Glucose, UA: 50 mg/dL — AB
Hgb urine dipstick: NEGATIVE
Ketones, ur: NEGATIVE mg/dL
Leukocytes, UA: NEGATIVE
NITRITE: NEGATIVE
PH: 5 (ref 5.0–8.0)
Protein, ur: NEGATIVE mg/dL
SPECIFIC GRAVITY, URINE: 1.021 (ref 1.005–1.030)

## 2017-04-04 LAB — HEPATIC FUNCTION PANEL
ALT: 276 U/L — ABNORMAL HIGH (ref 17–63)
AST: 210 U/L — ABNORMAL HIGH (ref 15–41)
Albumin: 3.7 g/dL (ref 3.5–5.0)
Alkaline Phosphatase: 327 U/L — ABNORMAL HIGH (ref 38–126)
BILIRUBIN INDIRECT: 5.8 mg/dL — AB (ref 0.3–0.9)
BILIRUBIN TOTAL: 14.7 mg/dL — AB (ref 0.3–1.2)
Bilirubin, Direct: 8.9 mg/dL — ABNORMAL HIGH (ref 0.1–0.5)
TOTAL PROTEIN: 7.3 g/dL (ref 6.5–8.1)

## 2017-04-04 LAB — RAPID URINE DRUG SCREEN, HOSP PERFORMED
AMPHETAMINES: NOT DETECTED
BENZODIAZEPINES: NOT DETECTED
Barbiturates: NOT DETECTED
Cocaine: NOT DETECTED
Opiates: NOT DETECTED
TETRAHYDROCANNABINOL: NOT DETECTED

## 2017-04-04 LAB — BASIC METABOLIC PANEL
Anion gap: 10 (ref 5–15)
BUN: 7 mg/dL (ref 6–20)
CALCIUM: 9.8 mg/dL (ref 8.9–10.3)
CHLORIDE: 98 mmol/L — AB (ref 101–111)
CO2: 23 mmol/L (ref 22–32)
CREATININE: 1.12 mg/dL (ref 0.61–1.24)
GFR calc Af Amer: >60 mL/min (ref >60)
GFR calc non Af Amer: >60 mL/min (ref >60)
Glucose, Bld: 126 mg/dL — ABNORMAL HIGH (ref 65–99)
Potassium: 4 mmol/L (ref 3.5–5.1)
SODIUM: 131 mmol/L — AB (ref 135–145)

## 2017-04-04 LAB — PROTIME-INR
INR: 0.95
INR: 0.93
PROTHROMBIN TIME: 12.5 s (ref 11.4–15.2)
Prothrombin Time: 12.7 seconds (ref 11.4–15.2)

## 2017-04-04 LAB — CBC
HEMATOCRIT: 44.5 % (ref 39.0–52.0)
Hemoglobin: 14.9 g/dL (ref 13.0–17.0)
MCH: 31.6 pg (ref 26.0–34.0)
MCHC: 33.5 g/dL (ref 30.0–36.0)
MCV: 94.3 fL (ref 78.0–100.0)
PLATELETS: 221 10*3/uL (ref 150–400)
RBC: 4.72 MIL/uL (ref 4.22–5.81)
RDW: 19.8 % — ABNORMAL HIGH (ref 11.5–15.5)
WBC: 8.4 10*3/uL (ref 4.0–10.5)

## 2017-04-04 LAB — I-STAT TROPONIN, ED: Troponin i, poc: 0.01 ng/mL (ref 0.00–0.08)

## 2017-04-04 LAB — ETHANOL: ALCOHOL ETHYL (B): 9 mg/dL — AB (ref <5)

## 2017-04-04 LAB — APTT: APTT: 30 s (ref 24–36)

## 2017-04-04 LAB — LIPASE, BLOOD: LIPASE: 19 U/L (ref 11–51)

## 2017-04-04 LAB — AMMONIA: AMMONIA: 56 umol/L — AB (ref 9–35)

## 2017-04-04 LAB — TROPONIN I

## 2017-04-04 LAB — ACETAMINOPHEN LEVEL

## 2017-04-04 LAB — LACTIC ACID, PLASMA: Lactic Acid, Venous: 1.3 mmol/L (ref 0.5–1.9)

## 2017-04-04 SURGERY — ERCP, WITH INTERVENTION IF INDICATED
Anesthesia: General

## 2017-04-04 MED ORDER — DEXTROSE 5 % IV SOLN
INTRAVENOUS | Status: DC | PRN
  Administered 2017-04-04: 20 ug/min via INTRAVENOUS

## 2017-04-04 MED ORDER — LORAZEPAM 1 MG PO TABS: 0.0000 mg | ORAL_TABLET | Freq: Two times a day (BID) | ORAL | Status: DC

## 2017-04-04 MED ORDER — KETOROLAC TROMETHAMINE 15 MG/ML IJ SOLN
15.0000 mg | Freq: Four times a day (QID) | INTRAMUSCULAR | Status: DC | PRN
  Administered 2017-04-04: 15 mg via INTRAVENOUS
  Filled 2017-04-04: qty 1

## 2017-04-04 MED ORDER — LORAZEPAM 2 MG/ML IJ SOLN: 0.0000 mg | Freq: Two times a day (BID) | INTRAMUSCULAR | Status: DC

## 2017-04-04 MED ORDER — IOPAMIDOL (ISOVUE-300) INJECTION 61%
INTRAVENOUS | Status: AC
  Filled 2017-04-04: qty 50

## 2017-04-04 MED ORDER — ONDANSETRON HCL 4 MG/2ML IJ SOLN
INTRAMUSCULAR | Status: DC | PRN
  Administered 2017-04-04: 4 mg via INTRAVENOUS

## 2017-04-04 MED ORDER — INDOMETHACIN 50 MG RE SUPP: 100.0000 mg | Freq: Once | RECTAL | Status: DC

## 2017-04-04 MED ORDER — SUCCINYLCHOLINE CHLORIDE 20 MG/ML IJ SOLN
INTRAMUSCULAR | Status: DC | PRN
  Administered 2017-04-04: 120 mg via INTRAVENOUS

## 2017-04-04 MED ORDER — NITROGLYCERIN 0.4 MG SL SUBL
0.4000 mg | SUBLINGUAL_TABLET | SUBLINGUAL | Status: DC | PRN
  Administered 2017-04-04: 0.4 mg via SUBLINGUAL
  Filled 2017-04-04: qty 1

## 2017-04-04 MED ORDER — ONDANSETRON HCL 4 MG/2ML IJ SOLN: 4.0000 mg | Freq: Four times a day (QID) | INTRAMUSCULAR | Status: DC | PRN

## 2017-04-04 MED ORDER — SODIUM CHLORIDE 0.9 % IV SOLN
1.5000 g | Freq: Four times a day (QID) | INTRAVENOUS | Status: DC
  Administered 2017-04-04 – 2017-04-06 (×8): 1.5 g via INTRAVENOUS
  Filled 2017-04-04 (×9): qty 1.5

## 2017-04-04 MED ORDER — INDOMETHACIN 50 MG RE SUPP
RECTAL | Status: DC | PRN
  Administered 2017-04-04: 100 mg via RECTAL

## 2017-04-04 MED ORDER — ACETAMINOPHEN 325 MG PO TABS: 650.0000 mg | ORAL_TABLET | ORAL | Status: DC | PRN

## 2017-04-04 MED ORDER — MORPHINE SULFATE (PF) 4 MG/ML IV SOLN: 1.0000 mg | INTRAVENOUS | Status: DC | PRN

## 2017-04-04 MED ORDER — GLUCAGON HCL RDNA (DIAGNOSTIC) 1 MG IJ SOLR
INTRAMUSCULAR | Status: DC | PRN
  Administered 2017-04-04: .25 mg via INTRAVENOUS

## 2017-04-04 MED ORDER — GLUCAGON HCL RDNA (DIAGNOSTIC) 1 MG IJ SOLR
INTRAMUSCULAR | Status: AC
  Filled 2017-04-04: qty 1

## 2017-04-04 MED ORDER — INDOMETHACIN 50 MG RE SUPP
100.0000 mg | RECTAL | Status: AC
  Filled 2017-04-04: qty 2

## 2017-04-04 MED ORDER — LACTATED RINGERS IV SOLN
INTRAVENOUS | Status: DC | PRN
  Administered 2017-04-04: 13:00:00 via INTRAVENOUS

## 2017-04-04 MED ORDER — SODIUM CHLORIDE 0.9 % IV SOLN
3.0000 g | INTRAVENOUS | Status: DC
  Filled 2017-04-04: qty 3

## 2017-04-04 MED ORDER — SODIUM CHLORIDE 0.9 % IV SOLN
INTRAVENOUS | Status: DC
  Administered 2017-04-04 – 2017-04-06 (×4): via INTRAVENOUS

## 2017-04-04 MED ORDER — PANTOPRAZOLE SODIUM 40 MG PO TBEC
40.0000 mg | DELAYED_RELEASE_TABLET | Freq: Every day | ORAL | Status: DC
  Administered 2017-04-04 – 2017-04-06 (×3): 40 mg via ORAL
  Filled 2017-04-04 (×3): qty 1

## 2017-04-04 MED ORDER — PROMETHAZINE HCL 25 MG/ML IJ SOLN: 6.2500 mg | Freq: Four times a day (QID) | INTRAMUSCULAR | Status: DC | PRN

## 2017-04-04 MED ORDER — FENTANYL CITRATE (PF) 100 MCG/2ML IJ SOLN
INTRAMUSCULAR | Status: DC | PRN
  Administered 2017-04-04: 50 ug via INTRAVENOUS
  Administered 2017-04-04: 2 ug via INTRAVENOUS
  Administered 2017-04-04: 50 ug via INTRAVENOUS

## 2017-04-04 MED ORDER — SODIUM CHLORIDE 0.9 % IV SOLN
INTRAVENOUS | Status: DC | PRN
  Administered 2017-04-04: 30 mL

## 2017-04-04 MED ORDER — PROPOFOL 10 MG/ML IV BOLUS
INTRAVENOUS | Status: DC | PRN
  Administered 2017-04-04: 200 mg via INTRAVENOUS

## 2017-04-04 MED ORDER — THIAMINE HCL 100 MG/ML IJ SOLN: 100.0000 mg | Freq: Every day | INTRAMUSCULAR | Status: DC

## 2017-04-04 MED ORDER — VITAMIN B-1 100 MG PO TABS
100.0000 mg | ORAL_TABLET | Freq: Every day | ORAL | Status: DC
  Administered 2017-04-04 – 2017-04-06 (×3): 100 mg via ORAL
  Filled 2017-04-04 (×3): qty 1

## 2017-04-04 MED ORDER — COLCHICINE 0.6 MG PO TABS
0.6000 mg | ORAL_TABLET | Freq: Every day | ORAL | Status: DC
  Administered 2017-04-04 – 2017-04-06 (×3): 0.6 mg via ORAL
  Filled 2017-04-04 (×3): qty 1

## 2017-04-04 MED ORDER — LORAZEPAM 1 MG PO TABS
0.0000 mg | ORAL_TABLET | Freq: Four times a day (QID) | ORAL | Status: AC
  Administered 2017-04-04: 1 mg via ORAL
  Filled 2017-04-04: qty 1

## 2017-04-04 MED ORDER — SODIUM CHLORIDE 0.9 % IV SOLN: 1.5000 g | Freq: Once | INTRAVENOUS | Status: DC

## 2017-04-04 MED ORDER — LORAZEPAM 2 MG/ML IJ SOLN: 0.0000 mg | Freq: Four times a day (QID) | INTRAMUSCULAR | Status: AC

## 2017-04-04 MED ORDER — SODIUM CHLORIDE 0.9 % IV SOLN
INTRAVENOUS | Status: DC | PRN
  Administered 2017-04-04: 3 g via INTRAVENOUS

## 2017-04-04 MED ORDER — LIDOCAINE HCL (CARDIAC) 20 MG/ML IV SOLN
INTRAVENOUS | Status: DC | PRN
  Administered 2017-04-04: 50 mg via INTRAVENOUS

## 2017-04-04 MED ORDER — ENOXAPARIN SODIUM 40 MG/0.4ML ~~LOC~~ SOLN
40.0000 mg | Freq: Every day | SUBCUTANEOUS | Status: DC
Start: 2017-04-04 — End: 2017-04-06
  Administered 2017-04-05 – 2017-04-06 (×2): 40 mg via SUBCUTANEOUS
  Filled 2017-04-04 (×3): qty 0.4

## 2017-04-04 NOTE — Op Note (Signed)
[facility] Patient Name: William [name] Procedure Date : 04/04/2017 MRN: [id number] Attending MD: Meryl Dare , MD Date of Birth: October 08, 1966 CSN: [id number] Age: 51 Admit Type: Inpatient Procedure:                ERCP Indications:              Suspected bile duct stone(s), Jaundice, Elevated                            liver enzymes Providers:                Venita Lick. Russella Dar, MD, Dwain Sarna, RN, Oletha Blend, Technician Referring MD:             [facility] Medicines:                General Anesthesia Complications:            No immediate complications. Estimated Blood Loss:     Estimated blood loss was minimal. Procedure:                Pre-Anesthesia Assessment:                           - Prior to the procedure, a History and Physical                            was performed, and patient medications and                            allergies were reviewed. The patient's tolerance of                            previous anesthesia was also reviewed. The risks                            and benefits of the procedure and the sedation                            options and risks were discussed with the patient.                            All questions were answered, and informed consent                            was obtained. Prior Anticoagulants: The patient has                            taken no previous anticoagulant or antiplatelet                            agents. ASA Grade Assessment: III - A patient with  severe systemic disease. After reviewing the risks                            and benefits, the patient was deemed in                            satisfactory condition to undergo the procedure.                           After obtaining informed consent, the scope was                            passed under direct vision. Throughout the                            procedure, the patient's blood  pressure, pulse, and                            oxygen saturations were monitored continuously. The                            Duodenoscope was introduced through the mouth, and                            used to inject contrast into and used to inject                            contrast into the bile duct. The ERCP was                            accomplished without difficulty. The patient                            tolerated the procedure well. Scope In: Scope Out: Findings:      The scout film was normal. The esophagus was successfully intubated       under direct vision. The scope was advanced to a normal major papilla in       the descending duodenum without detailed examination of the pharynx,       larynx and associated structures, and upper GI tract. The upper GI tract       was grossly normal. The bile duct was deeply cannulated with a guidewire       and then with the short-nosed traction sphincterotome. Prior to that the       PD was cannulated with the guidewire 3-4 times before biliary       cannulation was obtained. Contrast was injected into the bile ducts. I       personally interpreted the bile duct images. There was appropriate flow       of contrast through the bile ducts. The common bile duct contained       filling defect(s) thought to be stones and sludge. The common bile duct       was diffusely dilated, acquired. The largest diameter was 9 mm. A       straight Roadrunner wire was passed into the biliary tree. An  8 mm       biliary sphincterotomy was made with a traction (standard)       sphincterotome using ERBE electrocautery. There was no       post-sphincterotomy bleeding. The biliary tree was swept with a 10 mm       balloon starting at the bifurcation. Three stones and sludge were       removed. No stones remained. Good biliary drainage after stones and       sludge were removed. The PD was not cannulated with the catheter or       injected by  intention. Impression:               - Three filling defects consistent with stones and                            sludge was seen on the cholangiogram.                           - The common bile duct was dilated, acquired.                           - Choledocholithiasis was found in the CBD.                            Complete removal was accomplished by biliary                            sphincterotomy and balloon extraction.                           - A biliary sphincterotomy was performed.                           - The biliary tree was swept. Recommendation:           - Return patient to hospital ward for ongoing care.                           - Avoid aspirin and nonsteroidal anti-inflammatory                            medicines for 1 week.                           - Trend LFTs.                           - Elective cholecystectomy. Procedure Code(s):        --- Professional ---                           [phone], Endoscopic retrograde                            cholangiopancreatography (ERCP); with removal of                            calculi/debris from  biliary/pancreatic duct(s)                           [id number], Endoscopic retrograde                            cholangiopancreatography (ERCP); with                            sphincterotomy/papillotomy Diagnosis Code(s):        --- Professional ---                           K80.50, Calculus of bile duct without cholangitis                            or cholecystitis without obstruction                           R17, Unspecified jaundice                           R74.8, Abnormal levels of other serum enzymes                           K83.8, Other specified diseases of biliary tract                           R93.2, Abnormal findings on diagnostic imaging of                            liver and biliary tract CPT copyright 2016 American Medical Association. All rights reserved. The codes documented in this report are preliminary and  upon coder review may  be revised to meet current compliance requirements. Meryl DareMalcolm T Yesenia Locurto, MD 04/04/2017 2:26:41 PM This report has been signed electronically. Number of Addenda: 0

## 2017-04-04 NOTE — ED Notes (Signed)
Attempted to call report

## 2017-04-04 NOTE — Anesthesia Postprocedure Evaluation (Signed)
Anesthesia Post Note  Patient: Almedia BallsLarry D Toves  Procedure(s) Performed: Procedure(s) (LRB): ENDOSCOPIC RETROGRADE CHOLANGIOPANCREATOGRAPHY (ERCP) (N/A)     Patient location during evaluation: PACU Anesthesia Type: General Level of consciousness: awake and alert Pain management: pain level controlled Vital Signs Assessment: post-procedure vital signs reviewed and stable Respiratory status: spontaneous breathing, nonlabored ventilation, respiratory function stable and patient connected to nasal cannula oxygen Cardiovascular status: blood pressure returned to baseline and stable Postop Assessment: no signs of nausea or vomiting Anesthetic complications: no    Last Vitals:  Vitals:   04/04/17 1458 04/04/17 1505  BP: (!) 142/95 (!) 137/94  Pulse: 84 81  Resp: (!) 21 16  Temp:      Last Pain:  Vitals:   04/04/17 1428  TempSrc: Oral  PainSc:                  Kennieth RadFitzgerald, Kimbra Marcelino E
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
## 2017-04-04 NOTE — Interval H&P Note (Signed)
History and Physical Interval Note:  04/04/2017 1:20 PM  William [name]  has presented today for surgery, with the diagnosis of jaundice.  Dilated bile ducts  The various methods of treatment have been discussed with the patient and family. After consideration of risks, benefits and other options for treatment, the patient has consented to  Procedure(s): ENDOSCOPIC RETROGRADE CHOLANGIOPANCREATOGRAPHY (ERCP) (N/A) as a surgical intervention .  The patient's history has been reviewed, patient examined, no change in status, stable for surgery.  I have reviewed the patient's chart and labs.  Questions were answered to the patient's satisfaction.     [name]

## 2017-04-04 NOTE — ED Provider Notes (Signed)
By signing my name below, I, [name], attest that this documentation has been prepared under the direction and in the presence of [name], Layla Maw, DO. Electronically Signed: [name], ED Scribe. 04/04/17. 4:09 AM.  TIME SEEN: 3:55 AM  CHIEF COMPLAINT: CP  HPI: HPI Comments: William [name] is a 51 y.o. male with a PMHx of appendectomy, alcohol abuse who presents to the Emergency Department via EMS, complaining of constant, left-sided CP onset 2 days ago. Pt describes pain as sharp and aching. No modifying factors. Pt reports associated SOB, dry cough, vomiting, and yellow eyes. Pt states he drinks alcohol everyday for 10-12 years (12 beers a day). Pt denies hx of liver failure. Pt denies drug use. Pt denies confusion, diarrhea, blood in stool or vomit, melena, abdominal pain or any other complaints at this time. Last bowel movement was today and was normal.   ROS: See HPI Constitutional: no fever  Eyes: no drainage, +yellow eyes ENT: no runny nose   Cardiovascular:  no chest pain  Resp: +SOB, +cough GI: +vomiting, +constipation, no blood in stool, no diarrhea, no abdominal pain GU: no dysuria Integumentary: no rash  Allergy: no hives  Musculoskeletal: no leg swelling  Neurological: no slurred speech ROS otherwise negative  PAST MEDICAL HISTORY/PAST SURGICAL HISTORY:  Past Medical History:  Diagnosis Date  . Gout     MEDICATIONS:  Prior to Admission medications   Medication Sig Start Date End Date Taking? Authorizing Provider  Aspirin-Salicylamide-Caffeine (BC HEADACHE PO) Take 1 packet by mouth daily as needed (pain).    [provider]  colchicine 0.6 MG tablet Take 0.6mg  (one tablet) by mouth twice daily until one of the following occurs: 1.  The pain is gone 2.  The side effects outweight the benefits Stop taking the medication 2 days after your symptoms have completely resolved. 03/21/17   Anders Simmonds, PA-C  ibuprofen (ADVIL,MOTRIN) 200 MG tablet  Take 200 mg by mouth every 6 (six) hours as needed for moderate pain.    [provider]  indomethacin (INDOCIN) 50 MG capsule Take 1 capsule (50 mg total) by mouth 2 (two) times daily with a meal. Prn Until pain subsides 02/13/17   Anders Simmonds, PA-C  ondansetron (ZOFRAN) 4 MG tablet Take 1 tablet (4 mg total) by mouth every 6 (six) hours. Patient not taking: Reported on 04/18/2015 01/26/15   Rancour, Jeannett Senior, MD  oxyCODONE (OXY IR/ROXICODONE) 5 MG immediate release tablet Take 1 tablet (5 mg total) by mouth every 4 (four) hours as needed for severe pain. Patient not taking: Reported on 02/13/2017 01/03/17   Garlon Hatchet, PA-C    ALLERGIES:  No Known Allergies  SOCIAL HISTORY:  Social History  Substance Use Topics  . Smoking status: Never Smoker  . Smokeless tobacco: Never Used  . Alcohol use Yes    FAMILY HISTORY: History reviewed. No pertinent family history.  EXAM: BP (!) 146/93 (BP Location: Right Arm)   Pulse (!) 102   Temp 98 F (36.7 C) (Oral)   Resp (!) 21   Ht 6' (1.829 m)   Wt 180 lb (81.6 kg)   SpO2 96%   BMI 24.41 kg/m  CONSTITUTIONAL: Alert and oriented x 3 and responds appropriately to questions. Well-appearing; well-nourished HEAD: Normocephalic EYES: Conjunctivae clear, pupils appear equal, EOMI, patient has scleral icterus ENT: normal nose; moist mucous membranes NECK: Supple, no meningismus, no nuchal rigidity, no LAD  CARD: RRR; S1 and S2 appreciated; no murmurs, no clicks,  no rubs, no gallops CHEST:  Chest wall is mildly tender to palpation over the left anterior chest wall which reproduces his pain.  No crepitus, ecchymosis, erythema, warmth, rash or other lesions present.   RESP: Normal chest excursion without splinting or tachypnea; breath sounds clear and equal bilaterally; no wheezes, no rhonchi, no rales, no hypoxia or respiratory distress, speaking full sentences ABD/GI: Normal bowel sounds; non-distended; soft, non-tender, no rebound, no  guarding, no peritoneal signs, no hepatosplenomegaly BACK:  The back appears normal and is non-tender to palpation, there is no CVA tenderness EXT: Normal ROM in all joints; non-tender to palpation; no edema; normal capillary refill; no cyanosis, no calf tenderness or swelling    SKIN: Normal color for age and race; warm; no rash NEURO: Moves all extremities equally, sensation to light touch intact diffusely, normal speech, cranial nerves II through XII intact, no clonus, no asterixis PSYCH: The patient's mood and manner are appropriate. Grooming and personal hygiene are appropriate.  MEDICAL DECISION MAKING: Patient here with signs of liver failure likely from alcohol abuse. He does not appear encephalopathic. Will obtain labs. Also complaining of atypical chest pain. EKG shows no ischemic abnormality. We'll obtain troponin and chest x-ray.  ED PROGRESS: Patient's labs show elevation of all liver function tests but normal lipase. Troponin is negative. Chest x-ray clear. Patient does not have a primary care provider. He does not have a GI specialist. I suspect this is from alcohol abuse.  Pt has a MELD score of 22 giving him an estimated three-month mortality of 19.6%. Will admit for new onset liver failure likely in the setting of alcohol abuse. Will obtain hepatitis panel, acetaminophen level and a right upper quadrant ultrasound. We'll discuss with medicine for admission.   6:44 AM Discussed patient's case with hospitalist, Dr. Katrinka BlazingSmith.  I have recommended admission and patient (and family if present) agree with this plan. Admitting physician will place admission orders.   I reviewed all nursing notes, vitals, pertinent previous records, EKGs, lab and urine results, imaging (as available).    EKG Interpretation  Date/Time:  Friday April 04 2017 03:43:59 EDT Ventricular Rate:  102 PR Interval:    QRS Duration: 94 QT Interval:  316 QTC Calculation: 412 R Axis:   15 Text Interpretation:  Sinus  tachycardia ST elev, probable normal early repol pattern No significant change since last tracing Confirmed by Rochele RaringWard, Manfred Laspina [phone]) on 04/04/2017 3:54:30 AM      I personally performed the services described in this documentation, which was scribed in my presence. The recorded information has been reviewed and is accurate.      Aidian Salomon, Layla MawKristen N, DO 04/04/17 [phone]

## 2017-04-04 NOTE — Anesthesia Procedure Notes (Signed)
Procedure Name: Intubation Date/Time: 04/04/2017 1:31 PM Performed by: Rejeana Brock L Pre-anesthesia Checklist: Patient identified, Emergency Drugs available, Suction available and Patient being monitored Patient Re-evaluated:Patient Re-evaluated prior to inductionOxygen Delivery Method: Circle System Utilized Preoxygenation: Pre-oxygenation with 100% oxygen Intubation Type: IV induction Ventilation: Mask ventilation without difficulty Laryngoscope Size: Mac and 4 Grade View: Grade I Tube type: Oral Tube size: 7.5 mm Number of attempts: 1 Airway Equipment and Method: Stylet and Oral airway Placement Confirmation: ETT inserted through vocal cords under direct vision,  positive ETCO2 and breath sounds checked- equal and bilateral Secured at: 23 cm Tube secured with: Tape Dental Injury: Teeth and Oropharynx as per pre-operative assessment

## 2017-04-04 NOTE — ED Triage Notes (Signed)
Pt from home via [facility]. Pt c/o L sided CP pain, non productive cough, SHOB, abd pain, vomiting, hematuria x2 days. CP worse w/ palpation, deep inspiration & coughing. Rates pain @ 8/10. Pt jaundiced. A&O x4 on arrival.
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
## 2017-04-04 NOTE — H&P (Deleted)
New onset liver failure from alcohol abuse. Patient reports drinking a lot of beer, but does not quantify. Has no PCP. On physical exam patient is jaundiced. AP 327, AST 210,  ALT 276, total bilirubin 14.7. Meld score= 22. Pending orders include Tylenol level, Hepatitis panel, RUQ U/S. Admit to a MedSurg bed. 

## 2017-04-04 NOTE — Anesthesia Preprocedure Evaluation (Addendum)
Anesthesia Evaluation  Patient identified by MRN, date of birth, ID band Patient awake    Reviewed: Allergy & Precautions, NPO status , Patient's Chart, lab work & pertinent test results  Airway Mallampati: II  TM Distance: >3 FB Neck ROM: Full    Dental  (+) Dental Advisory Given   Pulmonary neg pulmonary ROS,    breath sounds clear to auscultation       Cardiovascular negative cardio ROS   Rhythm:Regular Rate:Normal     Neuro/Psych negative neurological ROS     GI/Hepatic negative GI ROS, (+)     substance abuse  alcohol use, Hepatitis -Choledocolithiasis.    Endo/Other  negative endocrine ROS  Renal/GU negative Renal ROS     Musculoskeletal   Abdominal   Peds  Hematology negative hematology ROS (+)   Anesthesia Other Findings   Reproductive/Obstetrics                            Lab Results  Component Value Date   WBC 8.4 04/04/2017   HGB 14.9 04/04/2017   HCT 44.5 04/04/2017   MCV 94.3 04/04/2017   PLT 221 04/04/2017   Lab Results  Component Value Date   CREATININE 1.12 04/04/2017   BUN 7 04/04/2017   NA 131 (L) 04/04/2017   K 4.0 04/04/2017   CL 98 (L) 04/04/2017   CO2 23 04/04/2017    Anesthesia Physical Anesthesia Plan  ASA: III  Anesthesia Plan: General   Post-op Pain Management:    Induction: Intravenous  PONV Risk Score and Plan: 2 and Ondansetron and Dexamethasone  Airway Management Planned: Oral ETT  Additional Equipment:   Intra-op Plan:   Post-operative Plan: Extubation in OR  Informed Consent: I have reviewed the patients History and Physical, chart, labs and discussed the procedure including the risks, benefits and alternatives for the proposed anesthesia with the patient or authorized representative who has indicated his/her understanding and acceptance.   Dental advisory given  Plan Discussed with:   Anesthesia Plan Comments:         Anesthesia Quick Evaluation
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
## 2017-04-04 NOTE — Transfer of Care (Signed)
Immediate Anesthesia Transfer of Care Note  Patient: William [name]  Procedure(s) Performed: Procedure(s): ENDOSCOPIC RETROGRADE CHOLANGIOPANCREATOGRAPHY (ERCP) (N/A)  Patient Location: PACU  Anesthesia Type:General  Level of Consciousness: awake  Airway & Oxygen Therapy: Patient Spontanous Breathing and Patient connected to nasal cannula oxygen  Post-op Assessment: Report given to RN, Post -op Vital signs reviewed and stable and Patient moving all extremities X 4  Post vital signs: Reviewed and stable  Last Vitals:  Vitals:   04/04/17 0956 04/04/17 1304  BP: 123/74 134/79  Pulse: 96   Resp: 20 20  Temp:  36.9 C    Last Pain:  Vitals:   04/04/17 1304  TempSrc: Oral  PainSc:          Complications: No apparent anesthesia complications

## 2017-04-04 NOTE — Progress Notes (Signed)
New onset liver failure from alcohol abuse. Patient reports drinking a lot of beer, but does not quantify. Has no PCP. On physical exam patient is jaundiced. AP 327, AST 210,  ALT 276, total bilirubin 14.7. Meld score= 22. Pending orders include Tylenol level, Hepatitis panel, RUQ U/S. Admit to a MedSurg bed.

## 2017-04-05 ENCOUNTER — Observation Stay (HOSPITAL_BASED_OUTPATIENT_CLINIC_OR_DEPARTMENT_OTHER): Payer: Self-pay

## 2017-04-05 DIAGNOSIS — K8050 Calculus of bile duct without cholangitis or cholecystitis without obstruction: Secondary | ICD-10-CM | POA: Diagnosis present

## 2017-04-05 DIAGNOSIS — K7200 Acute and subacute hepatic failure without coma: Secondary | ICD-10-CM

## 2017-04-05 DIAGNOSIS — R072 Precordial pain: Secondary | ICD-10-CM

## 2017-04-05 LAB — CBC WITH DIFFERENTIAL/PLATELET
BASOS ABS: 0 10*3/uL (ref 0.0–0.1)
Basophils Relative: 0 %
EOS ABS: 0.1 10*3/uL (ref 0.0–0.7)
Eosinophils Relative: 3 %
HEMATOCRIT: 41.2 % (ref 39.0–52.0)
Hemoglobin: 13.3 g/dL (ref 13.0–17.0)
Lymphocytes Relative: 23 %
Lymphs Abs: 0.8 10*3/uL (ref 0.7–4.0)
MCH: 31.3 pg (ref 26.0–34.0)
MCHC: 32.3 g/dL (ref 30.0–36.0)
MCV: 96.9 fL (ref 78.0–100.0)
Monocytes Absolute: 0.3 10*3/uL (ref 0.1–1.0)
Monocytes Relative: 9 %
NEUTROS ABS: 2.3 10*3/uL (ref 1.7–7.7)
NEUTROS PCT: 65 %
Platelets: 171 10*3/uL (ref 150–400)
RBC: 4.25 MIL/uL (ref 4.22–5.81)
RDW: 19.9 % — ABNORMAL HIGH (ref 11.5–15.5)
WBC: 3.6 10*3/uL — AB (ref 4.0–10.5)

## 2017-04-05 LAB — HEPATITIS PANEL, ACUTE
HEP A IGM: NEGATIVE
HEP B S AG: NEGATIVE
Hep B C IgM: NEGATIVE

## 2017-04-05 LAB — COMPREHENSIVE METABOLIC PANEL
ALK PHOS: 325 U/L — AB (ref 38–126)
ALT: 207 U/L — AB (ref 17–63)
ANION GAP: 10 (ref 5–15)
AST: 167 U/L — ABNORMAL HIGH (ref 15–41)
Albumin: 3.1 g/dL — ABNORMAL LOW (ref 3.5–5.0)
BUN: 11 mg/dL (ref 6–20)
CALCIUM: 9.1 mg/dL (ref 8.9–10.3)
CO2: 22 mmol/L (ref 22–32)
CREATININE: 1.01 mg/dL (ref 0.61–1.24)
Chloride: 107 mmol/L (ref 101–111)
Glucose, Bld: 98 mg/dL (ref 65–99)
Potassium: 4 mmol/L (ref 3.5–5.1)
SODIUM: 139 mmol/L (ref 135–145)
TOTAL PROTEIN: 6.4 g/dL — AB (ref 6.5–8.1)
Total Bilirubin: 12.9 mg/dL — ABNORMAL HIGH (ref 0.3–1.2)

## 2017-04-05 LAB — ECHOCARDIOGRAM COMPLETE
Height: 72 in
Weight: 2880 oz

## 2017-04-05 LAB — URIC ACID: URIC ACID, SERUM: 6.3 mg/dL (ref 4.4–7.6)

## 2017-04-05 LAB — HEMOGLOBIN A1C
HEMOGLOBIN A1C: 5.1 % (ref 4.8–5.6)
MEAN PLASMA GLUCOSE: 100 mg/dL

## 2017-04-05 LAB — HIV ANTIBODY (ROUTINE TESTING W REFLEX): HIV Screen 4th Generation wRfx: NONREACTIVE
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
## 2017-04-05 NOTE — Progress Notes (Signed)
Patient stated the physician said to discontinue telemetry earlier on 04/05/17 but no order was written.  Patient refusing to wear telemetry.  Will continue to monitor.  William CritchleyMarie Geofrey Silliman, RN

## 2017-04-05 NOTE — Progress Notes (Signed)
Echocardiogram 2D Echocardiogram has been performed.  Dorothey BasemanReel, Aarohi Redditt M 04/05/2017, 11:32 AM

## 2017-04-05 NOTE — Progress Notes (Signed)
Progress Note   Subjective  No abdominal pain, hungry   Objective  Vital signs in last 24 hours: Temp:  [97.6 F (36.4 C)-98.5 F (36.9 C)] 97.6 F (36.4 C) (06/23 0502) Pulse Rate:  [78-96] 78 (06/23 0502) Resp:  [16-26] 18 (06/23 0502) BP: (109-142)/(68-95) 109/76 (06/23 0502) SpO2:  [93 %-97 %] 97 % (06/23 0502) Weight:  [180 lb (81.6 kg)] 180 lb (81.6 kg) (06/22 1304) Last BM Date: 04/03/17  General: Alert, well-developed, jaundiced in NAD Heart:  Regular rate and rhythm; no murmurs Chest: Clear to ascultation bilaterally Abdomen:  Soft, nontender and nondistended. Normal bowel sounds, without guarding, and without rebound.   Extremities:  Without edema. Neurologic:  Alert and  oriented x4; grossly normal neurologically. Psych:  Alert and cooperative. Normal mood and affect.  Intake/Output from previous day: 06/22 0701 - 06/23 0700 In: 3032 [P.O.:742; I.V.:2190; IV Piggyback:100] Out: 1700 [Urine:1700] Intake/Output this shift: No intake/output data recorded.  Lab Results:  Recent Labs  04/04/17 0349 04/05/17 0342  WBC 8.4 3.6*  HGB 14.9 13.3  HCT 44.5 41.2  PLT 221 171   BMET  Recent Labs  04/04/17 0500 04/05/17 0342  NA 131* 139  K 4.0 4.0  CL 98* 107  CO2 23 22  GLUCOSE 126* 98  BUN 7 11  CREATININE 1.12 1.01  CALCIUM 9.8 9.1   LFT  Recent Labs  04/04/17 0500 04/05/17 0342  PROT 7.3 6.4*  ALBUMIN 3.7 3.1*  AST 210* 167*  ALT 276* 207*  ALKPHOS 327* 325*  BILITOT 14.7* 12.9*  BILIDIR 8.9*  --   IBILI 5.8*  --    PT/INR  Recent Labs  04/04/17 0403 04/04/17 1124  LABPROT 12.7 12.5  INR 0.95 0.93   Hepatitis Panel  Recent Labs  04/04/17 0636  HEPBSAG Negative  HCVAB <0.1  HEPAIGM Negative  HEPBIGM Negative    Studies/Results: Dg Chest 2 View  Result Date: 04/04/2017 CLINICAL DATA:  Chest pain and shortness of breath. EXAM: CHEST  2 VIEW COMPARISON:  01/26/2015 FINDINGS: Mild elevation of right hemidiaphragm,  chronic, with adjacent atelectasis. Minimal streaky atelectasis at the left lung base. Overall low lung volumes. Normal heart size and mediastinal contours. No pulmonary edema. No pneumothorax. No acute osseous abnormality. IMPRESSION: Mild chronic elevation of right hemidiaphragm. Bibasilar atelectasis. Electronically Signed   By: Rubye OaksMelanie  Ehinger M.D.   On: 04/04/2017 04:41   Dg Ercp Biliary & Pancreatic Ducts  Result Date: 04/04/2017 CLINICAL DATA:  51 year old male with a history of sphincterotomy EXAM: ERCP TECHNIQUE: Multiple spot images obtained with the fluoroscopic device and submitted for interpretation post-procedure. FLUOROSCOPY TIME:  Fluoroscopy Time:  2 minutes 59 seconds COMPARISON:  Ultrasound 04/04/2017 FINDINGS: Limited intraoperative fluoroscopic spot images during ERCP. Initial image demonstrates endoscope in the upper abdomen with cannulation of the ampulla and retrograde infusion of contrast. Subsequent images demonstrate deployment of a balloon trawl. Final image demonstrates retained contrast in the extrahepatic biliary system and removal of the endoscope with no stent. IMPRESSION: Limited images during ERCP demonstrates deployment of balloon trawl. Please refer to the dictated operative report for full details of intraoperative findings and procedure. Electronically Signed   By: Gilmer MorJaime  Wagner D.O.   On: 04/04/2017 15:07   [place] Abdomen Limited Ruq  Result Date: 04/04/2017 CLINICAL DATA:  Liver failure.  Alcohol abuse.  Chest pain. EXAM: ULTRASOUND ABDOMEN LIMITED RIGHT UPPER QUADRANT COMPARISON:  None. FINDINGS: Gallbladder: Gallstones measure up to 1.5 cm in size. No gallbladder wall  thickening. No sonographic Murphy sign noted by sonographer. Common bile duct: Diameter: 8 mm, with incomplete visualization of the distal common duct. Liver: Mild intrahepatic biliary dilatation. Mildly heterogeneous hepatic echotexture diffusely without discrete focal abnormality identified. IMPRESSION:  1. Mild intrahepatic and extrahepatic biliary dilatation. Incomplete visualization of the distal common bile duct. 2. Cholelithiasis without evidence of acute cholecystitis. 3. Mild liver heterogeneity without focal abnormality identified. Electronically Signed   By: Sebastian Ache M.D.   On: 04/04/2017 07:44      Assessment & Plan   1. CBD stones/sludge removed at ERCP yesterday. Although no pus was noted it may have been obscured by the sludge. Plan for a 5 day course of IV/PO antibiotics. Advance diet today.    2. Cholelithiasis. Please make [facility] outpatient appt for patient for consideration of elective cholecystectomy.   3.  Alcohol related liver disease. Avoid all alcohol use. Could take several weeks to determine acute and chronic components to his LFT elevation. Continue to observe for withdrawal. No more than 2 g of tylenol daily long term. Consider alcohol rehab placement. Medicaid eligibility.   GI available as needed.   Principal Problem:   Liver failure (HCC) Active Problems:   History of gout   Elevated blood-pressure reading without diagnosis of hypertension   Acute hyponatremia   Acute hyperglycemia   Alcoholism (HCC)   Acute alcoholic hepatitis   Cholelithiasis   Dilated cbd, acquired   Jaundice   Elevated LFTs     LOS: 0 days   Malcolm T. Russella Dar MD  04/05/2017, 9:26 AM

## 2017-04-06 ENCOUNTER — Encounter (HOSPITAL_COMMUNITY): Payer: Self-pay | Admitting: Gastroenterology

## 2017-04-06 LAB — CBC WITH DIFFERENTIAL/PLATELET
Basophils Absolute: 0 10*3/uL (ref 0.0–0.1)
Basophils Relative: 0 %
Eosinophils Absolute: 0.2 10*3/uL (ref 0.0–0.7)
Eosinophils Relative: 3 %
HEMATOCRIT: 38.8 % — AB (ref 39.0–52.0)
HEMOGLOBIN: 12.6 g/dL — AB (ref 13.0–17.0)
LYMPHS ABS: 1.4 10*3/uL (ref 0.7–4.0)
LYMPHS PCT: 24 %
MCH: 31.5 pg (ref 26.0–34.0)
MCHC: 32.5 g/dL (ref 30.0–36.0)
MCV: 97 fL (ref 78.0–100.0)
MONOS PCT: 10 %
Monocytes Absolute: 0.6 10*3/uL (ref 0.1–1.0)
NEUTROS ABS: 3.8 10*3/uL (ref 1.7–7.7)
NEUTROS PCT: 63 %
Platelets: 189 10*3/uL (ref 150–400)
RBC: 4 MIL/uL — AB (ref 4.22–5.81)
RDW: 19.9 % — ABNORMAL HIGH (ref 11.5–15.5)
WBC: 6 10*3/uL (ref 4.0–10.5)

## 2017-04-06 LAB — COMPREHENSIVE METABOLIC PANEL
ALK PHOS: 263 U/L — AB (ref 38–126)
ALT: 141 U/L — AB (ref 17–63)
AST: 64 U/L — ABNORMAL HIGH (ref 15–41)
Albumin: 3.1 g/dL — ABNORMAL LOW (ref 3.5–5.0)
Anion gap: 7 (ref 5–15)
BILIRUBIN TOTAL: 4.2 mg/dL — AB (ref 0.3–1.2)
BUN: 12 mg/dL (ref 6–20)
CALCIUM: 9.4 mg/dL (ref 8.9–10.3)
CO2: 25 mmol/L (ref 22–32)
CREATININE: 1 mg/dL (ref 0.61–1.24)
Chloride: 108 mmol/L (ref 101–111)
GFR calc non Af Amer: >60 mL/min (ref >60)
GLUCOSE: 99 mg/dL (ref 65–99)
Potassium: 3.9 mmol/L (ref 3.5–5.1)
SODIUM: 140 mmol/L (ref 135–145)
Total Protein: 6.5 g/dL (ref 6.5–8.1)

## 2017-04-06 LAB — PROTIME-INR
INR: 0.97
PROTHROMBIN TIME: 12.9 s (ref 11.4–15.2)

## 2017-04-06 LAB — MAGNESIUM: Magnesium: 2.2 mg/dL (ref 1.7–2.4)

## 2017-04-06 MED ORDER — PANTOPRAZOLE SODIUM 40 MG PO TBEC: 40.0000 mg | DELAYED_RELEASE_TABLET | Freq: Every day | ORAL | 0 refills | Status: DC

## 2017-04-06 MED ORDER — AMOXICILLIN-POT CLAVULANATE 500-125 MG PO TABS: 1.0000 | ORAL_TABLET | Freq: Three times a day (TID) | ORAL | 0 refills | Status: AC

## 2017-04-06 MED ORDER — THIAMINE HCL 100 MG PO TABS: 100.0000 mg | ORAL_TABLET | Freq: Every day | ORAL | 0 refills | Status: DC

## 2017-04-06 NOTE — Care Management (Signed)
Pt to discharge home today.  Pt states he is independent from home.  Pt is active with [facility] and gets medications filled there.  Per bedside nurse pt will have today's dose of Antibiotics here in the hospital today and then will need to start PO Augmentin tomorrow.  Pt confirmed he will  to pick up prescriptions tomorrow .  Pt denied barriers to discharging home

## 2017-04-06 NOTE — Progress Notes (Signed)
Patient discharge teaching given, including activity, diet, follow-up appoints, and medications. Patient verbalized understanding of all discharge instructions. IV access was d/c'd. Vitals are stable. Skin is intact except as charted in most recent assessments. Pt to be escorted out by NT, to be driven home by friend. 

## 2017-04-06 NOTE — Discharge Instructions (Signed)
Low-Fat Diet for Pancreatitis or Gallbladder Conditions A low-fat diet can be helpful if you have pancreatitis or a gallbladder condition. With these conditions, your pancreas and gallbladder have trouble digesting fats. A healthy eating plan with less fat will help rest your pancreas and gallbladder and reduce your symptoms. What do I need to know about this diet?  Eat a low-fat diet. ? Reduce your fat intake to less than 20-30% of your total daily calories. This is less than 50-60 g of fat per day. ? Remember that you need some fat in your diet. Ask your dietician what your daily goal should be. ? Choose nonfat and low-fat healthy foods. Look for the words "nonfat," "low fat," or "fat free." ? As a guide, look on the label and choose foods with less than 3 g of fat per serving. Eat only one serving.  Avoid alcohol.  Do not smoke. If you need help quitting, talk with your health care provider.  Eat small frequent meals instead of three large heavy meals. What foods can I eat? Grains Include healthy grains and starches such as potatoes, wheat bread, fiber-rich cereal, and brown rice. Choose whole grain options whenever possible. In adults, whole grains should account for 45-65% of your daily calories. Fruits and Vegetables Eat plenty of fruits and vegetables. Fresh fruits and vegetables add fiber to your diet. Meats and Other Protein Sources Eat lean meat such as chicken and pork. Trim any fat off of meat before cooking it. Eggs, fish, and beans are other sources of protein. In adults, these foods should account for 10-35% of your daily calories. Dairy Choose low-fat milk and dairy options. Dairy includes fat and protein, as well as calcium. Fats and Oils Limit high-fat foods such as fried foods, sweets, baked goods, sugary drinks. Other Creamy sauces and condiments, such as mayonnaise, can add extra fat. Think about whether or not you need to use them, or use smaller amounts or low fat  options. What foods are not recommended?  High fat foods, such as: ? Baked goods. ? Ice cream. ? French toast. ? Sweet rolls. ? Pizza. ? Cheese bread. ? Foods covered with batter, butter, creamy sauces, or cheese. ? Fried foods. ? Sugary drinks and desserts.  Foods that cause gas or bloating This information is not intended to replace advice given to you by your health care provider. Make sure you discuss any questions you have with your health care provider. Document Released: 10/05/2013 Document Revised: 03/07/2016 Document Reviewed: 09/13/2013 Elsevier Interactive Patient Education  2017 Elsevier Inc.  

## 2017-04-07 MED FILL — AMOX-CLAV 500-125 MG TABLET: 500-125 | 4 days supply | Qty: 12 | Fill #0

## 2017-04-07 MED FILL — ?PANTOPRAZOLE SOD DR 40MG: 40 MG | 30 days supply | Qty: 30 | Fill #0

## 2017-04-08 MED FILL — COLCHICINE 0.6 MG TABLET: 0.6 | 10 days supply | Qty: 20 | Fill #1
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
## 2017-04-21 ENCOUNTER — Other Ambulatory Visit: Payer: Self-pay | Admitting: *Deleted

## 2017-04-21 DIAGNOSIS — M25561 Pain in right knee: Secondary | ICD-10-CM

## 2017-04-21 DIAGNOSIS — M1A0610 Idiopathic chronic gout, right knee, without tophus (tophi): Secondary | ICD-10-CM

## 2017-04-21 MED ORDER — COLCHICINE 0.6 MG PO TABS: ORAL_TABLET | ORAL | 3 refills | Status: DC

## 2017-04-21 NOTE — Telephone Encounter (Signed)
PRINTED FOR PASS PROGRAM 

## 2017-07-25 ENCOUNTER — Other Ambulatory Visit: Payer: Self-pay | Admitting: Physician Assistant

## 2017-07-25 ENCOUNTER — Emergency Department (HOSPITAL_COMMUNITY)
Admission: EM | Admit: 2017-07-25 | Discharge: 2017-07-25 | Disposition: A | Discharge status: Home / Self Care | Payer: Self-pay | Attending: Emergency Medicine | Admitting: Emergency Medicine

## 2017-07-25 ENCOUNTER — Encounter (HOSPITAL_COMMUNITY): Payer: Self-pay | Admitting: Emergency Medicine

## 2017-07-25 DIAGNOSIS — M1A0610 Idiopathic chronic gout, right knee, without tophus (tophi): Secondary | ICD-10-CM

## 2017-07-25 DIAGNOSIS — M109 Gout, unspecified: Secondary | ICD-10-CM | POA: Insufficient documentation

## 2017-07-25 DIAGNOSIS — M25561 Pain in right knee: Secondary | ICD-10-CM

## 2017-07-25 DIAGNOSIS — K7290 Hepatic failure, unspecified without coma: Secondary | ICD-10-CM | POA: Insufficient documentation

## 2017-07-25 DIAGNOSIS — Z79899 Other long term (current) drug therapy: Secondary | ICD-10-CM | POA: Insufficient documentation

## 2017-07-25 MED ORDER — COLCHICINE 0.6 MG PO TABS: 1.2000 mg | ORAL_TABLET | Freq: Once | ORAL | Status: DC

## 2017-07-25 MED ORDER — PREDNISONE 20 MG PO TABS: 40.0000 mg | ORAL_TABLET | Freq: Every day | ORAL | 0 refills | Status: AC

## 2017-07-25 MED ORDER — OXYCODONE HCL 5 MG PO TABS: 5.0000 mg | ORAL_TABLET | Freq: Four times a day (QID) | ORAL | 0 refills | Status: DC | PRN

## 2017-07-25 MED ORDER — OXYCODONE HCL 5 MG PO TABS
5.0000 mg | ORAL_TABLET | Freq: Once | ORAL | Status: AC
  Administered 2017-07-25: 5 mg via ORAL
  Filled 2017-07-25: qty 1

## 2017-07-25 MED ORDER — COLCHICINE 0.6 MG PO TABS: ORAL_TABLET | ORAL | 0 refills | Status: DC

## 2017-07-25 MED FILL — COLCHICINE 0.6 MG TABLET: 0.6 | 7 days supply | Qty: 8 | Fill #0

## 2017-07-25 MED FILL — ?PREDNISONE 20MG TABLET: 20 | 5 days supply | Qty: 10 | Fill #0
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
## 2017-07-25 NOTE — ED Triage Notes (Signed)
Pt rpeorts gout in R knee and R hand, states, "I'm out of my medicine."

## 2017-07-25 NOTE — Discharge Instructions (Signed)
Please read instructions below. Take 2 tablets together then take 1 tablet one hour later today. Starting tomorrow, take 1 tablet daily until symptoms improve. Take 1 tablet daily for 2 days after your symptoms resolve. Begin taking the prednisone as prescribed. You can take oxycodone every 6 hours as needed for severe pain. Do not drink alcohol or drive while taking this medication. Follow up with your primary care provider in 2 days. Return to the ER if symptoms last longer than 3 days, fever, or new or concerning symptoms.

## 2017-11-10 ENCOUNTER — Emergency Department (HOSPITAL_COMMUNITY): Payer: Self-pay

## 2017-11-10 ENCOUNTER — Encounter (HOSPITAL_COMMUNITY): Payer: Self-pay | Admitting: Emergency Medicine

## 2017-11-10 ENCOUNTER — Emergency Department (HOSPITAL_COMMUNITY)
Admission: EM | Admit: 2017-11-10 | Discharge: 2017-11-10 | Disposition: A | Discharge status: Home / Self Care | Payer: Self-pay | Attending: Emergency Medicine | Admitting: Emergency Medicine

## 2017-11-10 DIAGNOSIS — Y929 Unspecified place or not applicable: Secondary | ICD-10-CM | POA: Insufficient documentation

## 2017-11-10 DIAGNOSIS — Z23 Encounter for immunization: Secondary | ICD-10-CM | POA: Insufficient documentation

## 2017-11-10 DIAGNOSIS — Z7982 Long term (current) use of aspirin: Secondary | ICD-10-CM | POA: Insufficient documentation

## 2017-11-10 DIAGNOSIS — Y999 Unspecified external cause status: Secondary | ICD-10-CM | POA: Insufficient documentation

## 2017-11-10 DIAGNOSIS — Y939 Activity, unspecified: Secondary | ICD-10-CM | POA: Insufficient documentation

## 2017-11-10 DIAGNOSIS — Z79899 Other long term (current) drug therapy: Secondary | ICD-10-CM | POA: Insufficient documentation

## 2017-11-10 DIAGNOSIS — S01411A Laceration without foreign body of right cheek and temporomandibular area, initial encounter: Secondary | ICD-10-CM | POA: Insufficient documentation

## 2017-11-10 MED ORDER — IBUPROFEN 400 MG PO TABS
600.0000 mg | ORAL_TABLET | Freq: Once | ORAL | Status: AC
  Administered 2017-11-10: 600 mg via ORAL
  Filled 2017-11-10: qty 1

## 2017-11-10 MED ORDER — TETANUS-DIPHTH-ACELL PERTUSSIS 5-2.5-18.5 LF-MCG/0.5 IM SUSP
0.5000 mL | Freq: Once | INTRAMUSCULAR | Status: AC
  Administered 2017-11-10: 0.5 mL via INTRAMUSCULAR
  Filled 2017-11-10: qty 0.5

## 2017-11-10 MED ORDER — LIDOCAINE HCL (PF) 1 % IJ SOLN
15.0000 mL | Freq: Once | INTRAMUSCULAR | Status: AC
  Administered 2017-11-10: 15 mL
  Filled 2017-11-10: qty 15

## 2017-11-10 NOTE — Discharge Instructions (Signed)
WOUND CARE Please have your stitches/staples removed in 3-5 or sooner if you have concerns. You may do this at any available urgent care or at your primary care doctor's office. May take Motrin Tylenol for pain.  Keep ice on the area to help with the swelling.  Keep area clean and dry for 24 hours. Do not remove bandage, if applied.  After 24 hours, remove bandage and wash wound gently with mild soap and warm water. Reapply a new bandage after cleaning wound, if directed.  Continue daily cleansing with soap and water until stitches/staples are removed.  Do not apply any ointments or creams to the wound while stitches/staples are in place, as this may cause delayed healing.  Seek medical careif you experience any of the following signs of infection: Swelling, redness, pus drainage, streaking, fever >101.0 F  Seek care if you experience excessive bleeding that does not stop after 15-20 minutes of constant, firm pressure.
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
## 2017-11-15 ENCOUNTER — Emergency Department (HOSPITAL_COMMUNITY)
Admission: EM | Admit: 2017-11-15 | Discharge: 2017-11-15 | Disposition: A | Discharge status: Home / Self Care | Payer: Self-pay | Attending: Emergency Medicine | Admitting: Emergency Medicine

## 2017-11-15 ENCOUNTER — Encounter (HOSPITAL_COMMUNITY): Payer: Self-pay

## 2017-11-15 ENCOUNTER — Other Ambulatory Visit: Payer: Self-pay

## 2017-11-15 DIAGNOSIS — S01411D Laceration without foreign body of right cheek and temporomandibular area, subsequent encounter: Secondary | ICD-10-CM | POA: Insufficient documentation

## 2017-11-15 DIAGNOSIS — Z4802 Encounter for removal of sutures: Secondary | ICD-10-CM

## 2017-11-15 DIAGNOSIS — Y33XXXD Other specified events, undetermined intent, subsequent encounter: Secondary | ICD-10-CM | POA: Insufficient documentation

## 2017-11-15 NOTE — ED Triage Notes (Signed)
Pt here for stitch removal. Has no other complaints. VSS.

## 2017-11-15 NOTE — ED Notes (Signed)
Declined W/C at D/C and was escorted to lobby by RN. 
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
## 2017-11-15 NOTE — Discharge Instructions (Signed)
Continue to keep wound clean and dry.  Return to ER for new or worsening symptoms, any additional concerns.

## 2018-08-18 ENCOUNTER — Emergency Department (HOSPITAL_COMMUNITY)
Admission: EM | Admit: 2018-08-18 | Discharge: 2018-08-18 | Disposition: A | Discharge status: Home / Self Care | Payer: Self-pay | Attending: Emergency Medicine | Admitting: Emergency Medicine

## 2018-08-18 ENCOUNTER — Encounter (HOSPITAL_COMMUNITY): Payer: Self-pay | Admitting: Emergency Medicine

## 2018-08-18 DIAGNOSIS — M549 Dorsalgia, unspecified: Secondary | ICD-10-CM | POA: Insufficient documentation

## 2018-08-18 DIAGNOSIS — Z5321 Procedure and treatment not carried out due to patient leaving prior to being seen by health care provider: Secondary | ICD-10-CM | POA: Insufficient documentation

## 2018-08-18 LAB — COMPREHENSIVE METABOLIC PANEL
ALBUMIN: 3.7 g/dL (ref 3.5–5.0)
ALK PHOS: 122 U/L (ref 38–126)
ALT: 37 U/L (ref 0–44)
AST: 38 U/L (ref 15–41)
Anion gap: 9 (ref 5–15)
BUN: 10 mg/dL (ref 6–20)
CALCIUM: 9.7 mg/dL (ref 8.9–10.3)
CO2: 25 mmol/L (ref 22–32)
CREATININE: 1.18 mg/dL (ref 0.61–1.24)
Chloride: 107 mmol/L (ref 98–111)
GFR calc Af Amer: >60 mL/min (ref >60)
GFR calc non Af Amer: >60 mL/min (ref >60)
GLUCOSE: 106 mg/dL — AB (ref 70–99)
Potassium: 4.9 mmol/L (ref 3.5–5.1)
SODIUM: 141 mmol/L (ref 135–145)
Total Bilirubin: 0.8 mg/dL (ref 0.3–1.2)
Total Protein: 7 g/dL (ref 6.5–8.1)

## 2018-08-18 LAB — CBC
HEMATOCRIT: 47.5 % (ref 39.0–52.0)
Hemoglobin: 15 g/dL (ref 13.0–17.0)
MCH: 30.7 pg (ref 26.0–34.0)
MCHC: 31.6 g/dL (ref 30.0–36.0)
MCV: 97.1 fL (ref 80.0–100.0)
NRBC: 0 % (ref 0.0–0.2)
PLATELETS: 245 10*3/uL (ref 150–400)
RBC: 4.89 MIL/uL (ref 4.22–5.81)
RDW: 13.3 % (ref 11.5–15.5)
WBC: 7.3 10*3/uL (ref 4.0–10.5)

## 2018-08-18 LAB — LIPASE, BLOOD: Lipase: 27 U/L (ref 11–51)

## 2018-08-18 NOTE — ED Triage Notes (Signed)
Pt presents with back pain and shoulder pain since Saturday; was told he needs his gallbladder removed; pt denies CP, SOB, n/v/d

## 2018-08-18 NOTE — ED Notes (Signed)
Pt came to nurse first to advise he is no longer able to wait, as his ride is on way to get him and he really wants to go home and lay down.    Advised bed situation and importance of needing to stay to be seen.  Pt states he needs to leave with ride but that he will return in the morning around 8 or 9 am, this nurse gave a bus pass so that he may return to ED in the morning.  Callie, charge RN made aware of situation, no other options at this time.

## 2018-08-22 ENCOUNTER — Other Ambulatory Visit: Payer: Self-pay

## 2018-08-22 ENCOUNTER — Emergency Department (HOSPITAL_COMMUNITY)
Admission: EM | Admit: 2018-08-22 | Discharge: 2018-08-22 | Disposition: A | Discharge status: Home / Self Care | Payer: Self-pay | Attending: Emergency Medicine | Admitting: Emergency Medicine

## 2018-08-22 ENCOUNTER — Encounter (HOSPITAL_COMMUNITY): Payer: Self-pay

## 2018-08-22 ENCOUNTER — Emergency Department (HOSPITAL_COMMUNITY): Payer: Self-pay

## 2018-08-22 DIAGNOSIS — Z7982 Long term (current) use of aspirin: Secondary | ICD-10-CM | POA: Insufficient documentation

## 2018-08-22 DIAGNOSIS — R079 Chest pain, unspecified: Secondary | ICD-10-CM | POA: Insufficient documentation

## 2018-08-22 DIAGNOSIS — Z79899 Other long term (current) drug therapy: Secondary | ICD-10-CM | POA: Insufficient documentation

## 2018-08-22 LAB — CBC WITH DIFFERENTIAL/PLATELET
ABS IMMATURE GRANULOCYTES: 0.02 10*3/uL (ref 0.00–0.07)
Basophils Absolute: 0 10*3/uL (ref 0.0–0.1)
Basophils Relative: 1 %
Eosinophils Absolute: 0.2 10*3/uL (ref 0.0–0.5)
Eosinophils Relative: 3 %
HEMATOCRIT: 48.3 % (ref 39.0–52.0)
Hemoglobin: 15.1 g/dL (ref 13.0–17.0)
IMMATURE GRANULOCYTES: 0 %
LYMPHS ABS: 1.8 10*3/uL (ref 0.7–4.0)
LYMPHS PCT: 22 %
MCH: 30.2 pg (ref 26.0–34.0)
MCHC: 31.3 g/dL (ref 30.0–36.0)
MCV: 96.6 fL (ref 80.0–100.0)
MONOS PCT: 11 %
Monocytes Absolute: 0.9 10*3/uL (ref 0.1–1.0)
NEUTROS ABS: 5.1 10*3/uL (ref 1.7–7.7)
Neutrophils Relative %: 63 %
PLATELETS: 349 10*3/uL (ref 150–400)
RBC: 5 MIL/uL (ref 4.22–5.81)
RDW: 12.9 % (ref 11.5–15.5)
WBC: 8 10*3/uL (ref 4.0–10.5)
nRBC: 0 % (ref 0.0–0.2)

## 2018-08-22 LAB — I-STAT TROPONIN, ED
TROPONIN I, POC: 0 ng/mL (ref 0.00–0.08)
Troponin i, poc: 0 ng/mL (ref 0.00–0.08)

## 2018-08-22 LAB — BASIC METABOLIC PANEL
ANION GAP: 8 (ref 5–15)
BUN: 15 mg/dL (ref 6–20)
CO2: 25 mmol/L (ref 22–32)
Calcium: 9.8 mg/dL (ref 8.9–10.3)
Chloride: 104 mmol/L (ref 98–111)
Creatinine, Ser: 1.28 mg/dL — ABNORMAL HIGH (ref 0.61–1.24)
Glucose, Bld: 103 mg/dL — ABNORMAL HIGH (ref 70–99)
POTASSIUM: 4.3 mmol/L (ref 3.5–5.1)
Sodium: 137 mmol/L (ref 135–145)

## 2018-08-22 LAB — ETHANOL

## 2018-08-22 NOTE — ED Triage Notes (Signed)
Per [facility] Pt has left sided chest pain that started this morning. Pt denies any cardiac history. Pt has not had any PO intake for three days. EMS gave one nitro dose of 0.4mg  and 324 of aspirin PO. Pt states he had "swig" of alcohol last night, but nothing today.

## 2018-08-22 NOTE — Discharge Instructions (Signed)
It was my pleasure taking care of you today!   You were seen in the Emergency Department today for chest pain.  As we have discussed, todays blood work and imaging are normal, but you may require further testing.  Please call your primary care physician to schedule a follow up appointment to discuss your ER visit today.   Return to the Emergency Department if you experience any new or worsening symptoms, any additional concerns.   To find a primary care or specialty doctor please call [phone] or [phone] to access "[place] Find a Doctor Service."  You may also go on the [facility] website at [email or web address]  There are also multiple [facility] practices throughout the [place] that are frequently accepting new patients. You may find a clinic that is close to your home and contact them.  [facility] - [address] [place] [address] [phone]  [facility] in [place] (also locations in [place] and [place]) - [address] [employer] [place] [phone]  [facility] Department - [address] [place] [phone]
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
## 2018-09-27 ENCOUNTER — Encounter (HOSPITAL_COMMUNITY): Payer: Self-pay

## 2018-09-27 ENCOUNTER — Emergency Department (HOSPITAL_COMMUNITY): Payer: Self-pay

## 2018-09-27 ENCOUNTER — Emergency Department (HOSPITAL_COMMUNITY)
Admission: EM | Admit: 2018-09-27 | Discharge: 2018-09-27 | Disposition: A | Discharge status: Home / Self Care | Payer: Self-pay | Attending: Emergency Medicine | Admitting: Emergency Medicine

## 2018-09-27 DIAGNOSIS — F1020 Alcohol dependence, uncomplicated: Secondary | ICD-10-CM | POA: Insufficient documentation

## 2018-09-27 DIAGNOSIS — R509 Fever, unspecified: Secondary | ICD-10-CM | POA: Insufficient documentation

## 2018-09-27 DIAGNOSIS — K047 Periapical abscess without sinus: Secondary | ICD-10-CM | POA: Insufficient documentation

## 2018-09-27 LAB — COMPREHENSIVE METABOLIC PANEL
ALBUMIN: 4.3 g/dL (ref 3.5–5.0)
ALT: 53 U/L — ABNORMAL HIGH (ref 0–44)
ANION GAP: 12 (ref 5–15)
AST: 50 U/L — ABNORMAL HIGH (ref 15–41)
Alkaline Phosphatase: 136 U/L — ABNORMAL HIGH (ref 38–126)
BILIRUBIN TOTAL: 2 mg/dL — AB (ref 0.3–1.2)
BUN: 11 mg/dL (ref 6–20)
CO2: 27 mmol/L (ref 22–32)
Calcium: 10.1 mg/dL (ref 8.9–10.3)
Chloride: 99 mmol/L (ref 98–111)
Creatinine, Ser: 1.16 mg/dL (ref 0.61–1.24)
GFR calc Af Amer: >60 mL/min (ref >60)
GFR calc non Af Amer: >60 mL/min (ref >60)
GLUCOSE: 100 mg/dL — AB (ref 70–99)
POTASSIUM: 3.6 mmol/L (ref 3.5–5.1)
SODIUM: 138 mmol/L (ref 135–145)
TOTAL PROTEIN: 9.2 g/dL — AB (ref 6.5–8.1)

## 2018-09-27 LAB — URINALYSIS, ROUTINE W REFLEX MICROSCOPIC
Bacteria, UA: NONE SEEN
Bilirubin Urine: NEGATIVE
Glucose, UA: NEGATIVE mg/dL
Ketones, ur: NEGATIVE mg/dL
Leukocytes, UA: NEGATIVE
Nitrite: NEGATIVE
PROTEIN: NEGATIVE mg/dL
Specific Gravity, Urine: 1.023 (ref 1.005–1.030)
pH: 5 (ref 5.0–8.0)

## 2018-09-27 LAB — I-STAT CHEM 8, ED
BUN: 10 mg/dL (ref 6–20)
CHLORIDE: 100 mmol/L (ref 98–111)
Calcium, Ion: 1.22 mmol/L (ref 1.15–1.40)
Creatinine, Ser: 1.1 mg/dL (ref 0.61–1.24)
Glucose, Bld: 97 mg/dL (ref 70–99)
HCT: 52 % (ref 39.0–52.0)
Hemoglobin: 17.7 g/dL — ABNORMAL HIGH (ref 13.0–17.0)
Potassium: 3.7 mmol/L (ref 3.5–5.1)
Sodium: 138 mmol/L (ref 135–145)
TCO2: 28 mmol/L (ref 22–32)

## 2018-09-27 LAB — RAPID URINE DRUG SCREEN, HOSP PERFORMED
Amphetamines: NOT DETECTED
Barbiturates: NOT DETECTED
Benzodiazepines: NOT DETECTED
Cocaine: POSITIVE — AB
Opiates: NOT DETECTED
Tetrahydrocannabinol: NOT DETECTED

## 2018-09-27 LAB — CBC WITH DIFFERENTIAL/PLATELET
Abs Immature Granulocytes: 0.03 10*3/uL (ref 0.00–0.07)
BASOS ABS: 0.1 10*3/uL (ref 0.0–0.1)
Basophils Relative: 1 %
EOS ABS: 0.1 10*3/uL (ref 0.0–0.5)
EOS PCT: 1 %
HCT: 50.6 % (ref 39.0–52.0)
Hemoglobin: 15.9 g/dL (ref 13.0–17.0)
Immature Granulocytes: 0 %
Lymphocytes Relative: 18 %
Lymphs Abs: 1.8 10*3/uL (ref 0.7–4.0)
MCH: 30.2 pg (ref 26.0–34.0)
MCHC: 31.4 g/dL (ref 30.0–36.0)
MCV: 96.2 fL (ref 80.0–100.0)
Monocytes Absolute: 1 10*3/uL (ref 0.1–1.0)
Monocytes Relative: 10 %
NRBC: 0 % (ref 0.0–0.2)
Neutro Abs: 7 10*3/uL (ref 1.7–7.7)
Neutrophils Relative %: 70 %
Platelets: 301 10*3/uL (ref 150–400)
RBC: 5.26 MIL/uL (ref 4.22–5.81)
RDW: 14.4 % (ref 11.5–15.5)
WBC: 9.9 10*3/uL (ref 4.0–10.5)

## 2018-09-27 LAB — PROTIME-INR
INR: 1.01
Prothrombin Time: 13.2 seconds (ref 11.4–15.2)

## 2018-09-27 LAB — AMMONIA: Ammonia: 11 umol/L (ref 9–35)

## 2018-09-27 LAB — ETHANOL

## 2018-09-27 LAB — URIC ACID: Uric Acid, Serum: 6.8 mg/dL (ref 3.7–8.6)

## 2018-09-27 LAB — I-STAT CG4 LACTIC ACID, ED: Lactic Acid, Venous: 1.18 mmol/L (ref 0.5–1.9)

## 2018-09-27 MED ORDER — OXYCODONE HCL 5 MG PO TABS: 5.0000 mg | ORAL_TABLET | Freq: Four times a day (QID) | ORAL | 0 refills | Status: DC | PRN

## 2018-09-27 MED ORDER — FENTANYL CITRATE (PF) 100 MCG/2ML IJ SOLN
50.0000 ug | Freq: Once | INTRAMUSCULAR | Status: AC
  Administered 2018-09-27: 50 ug via INTRAVENOUS
  Filled 2018-09-27: qty 2

## 2018-09-27 MED ORDER — HYDROCODONE-ACETAMINOPHEN 5-325 MG PO TABS
2.0000 | ORAL_TABLET | Freq: Once | ORAL | Status: AC
  Administered 2018-09-27: 2 via ORAL
  Filled 2018-09-27: qty 2

## 2018-09-27 MED ORDER — LIDOCAINE HCL 2 % IJ SOLN
INTRAMUSCULAR | Status: AC
  Filled 2018-09-27: qty 20

## 2018-09-27 MED ORDER — ACETAMINOPHEN 325 MG PO TABS
650.0000 mg | ORAL_TABLET | Freq: Once | ORAL | Status: AC
  Administered 2018-09-27: 650 mg via ORAL
  Filled 2018-09-27: qty 2

## 2018-09-27 MED ORDER — CLINDAMYCIN PHOSPHATE 600 MG/50ML IV SOLN
600.0000 mg | Freq: Once | INTRAVENOUS | Status: AC
  Administered 2018-09-27: 600 mg via INTRAVENOUS
  Filled 2018-09-27: qty 50

## 2018-09-27 MED ORDER — IOHEXOL 300 MG/ML  SOLN
75.0000 mL | Freq: Once | INTRAMUSCULAR | Status: AC | PRN
  Administered 2018-09-27: 75 mL via INTRAVENOUS

## 2018-09-27 MED ORDER — SODIUM CHLORIDE (PF) 0.9 % IJ SOLN
INTRAMUSCULAR | Status: AC
  Filled 2018-09-27: qty 50

## 2018-09-27 MED ORDER — SODIUM CHLORIDE 0.9 % IV BOLUS
1000.0000 mL | Freq: Once | INTRAVENOUS | Status: AC
  Administered 2018-09-27: 1000 mL via INTRAVENOUS

## 2018-09-27 MED ORDER — LIDOCAINE-EPINEPHRINE (PF) 2 %-1:200000 IJ SOLN
20.0000 mL | Freq: Once | INTRAMUSCULAR | Status: AC
  Administered 2018-09-27: 20 mL
  Filled 2018-09-27: qty 20

## 2018-09-27 MED ORDER — CLINDAMYCIN HCL 300 MG PO CAPS: 300.0000 mg | ORAL_CAPSULE | Freq: Three times a day (TID) | ORAL | 0 refills | Status: DC
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
## 2018-09-27 NOTE — ED Notes (Signed)
ED Provider at bedside. 

## 2018-09-27 NOTE — ED Triage Notes (Addendum)
Arrived via [facility] from home. Patient c/o swelling in face mainly in upper lip that started thursday. Patient c/o right elbow, right hand, and right knee swelling. Patient also c/o of fever and chills. Pin point pupils. Patient denies alcohol and drug use.  Hx. Gout and appendectomy  A/Ox4 Wheelchair in triage

## 2018-09-27 NOTE — Discharge Instructions (Signed)
Take clindamycin three times daily for dental abscess.   Take motrin for pain   Take oxycodone for severe pain   Return in 2 days for wound check unless you can see a dentist   See list of dentist for follow up   Return to ER if you have fever, worse lip swelling or neck swelling, vomiting.

## 2018-09-27 NOTE — ED Notes (Signed)
Patient transported to X-ray 

## 2018-09-28 LAB — URINE CULTURE: Culture: NO GROWTH

## 2018-10-02 LAB — CULTURE, BLOOD (ROUTINE X 2)
CULTURE: NO GROWTH
Culture: NO GROWTH

## 2018-12-02 ENCOUNTER — Encounter (HOSPITAL_COMMUNITY): Payer: Self-pay | Admitting: Emergency Medicine

## 2018-12-02 ENCOUNTER — Emergency Department (HOSPITAL_COMMUNITY)
Admission: EM | Admit: 2018-12-02 | Discharge: 2018-12-02 | Disposition: A | Discharge status: Home / Self Care | Payer: Self-pay | Attending: Emergency Medicine | Admitting: Emergency Medicine

## 2018-12-02 DIAGNOSIS — M109 Gout, unspecified: Secondary | ICD-10-CM

## 2018-12-02 DIAGNOSIS — M10062 Idiopathic gout, left knee: Secondary | ICD-10-CM | POA: Insufficient documentation

## 2018-12-02 MED ORDER — HYDROCODONE-ACETAMINOPHEN 5-325 MG PO TABS: 1.0000 | ORAL_TABLET | Freq: Four times a day (QID) | ORAL | 0 refills | Status: DC | PRN

## 2018-12-02 MED ORDER — INDOMETHACIN 25 MG PO CAPS: 25.0000 mg | ORAL_CAPSULE | Freq: Three times a day (TID) | ORAL | 0 refills | Status: DC | PRN

## 2018-12-02 MED ORDER — HYDROCODONE-ACETAMINOPHEN 5-325 MG PO TABS
2.0000 | ORAL_TABLET | Freq: Once | ORAL | Status: AC
  Administered 2018-12-02: 2 via ORAL
  Filled 2018-12-02: qty 2
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
## 2018-12-02 NOTE — Discharge Instructions (Addendum)
Indomethacin as prescribed.  Hydrocodone as prescribed as needed for pain.  Follow-up with your primary doctor if symptoms or not improving in the next 2 to 3 days, and return to the ER if you develop worsening pain, high fever, or other new and concerning symptoms.

## 2018-12-02 NOTE — ED Notes (Signed)
Signature Pad not working. Patient unable to sign.

## 2018-12-02 NOTE — ED Triage Notes (Signed)
Pt c/o bilat knee swelling and pain that started last week. Also in right elbow.

## 2018-12-02 NOTE — ED Notes (Signed)
MD at bedside. 

## 2020-01-10 ENCOUNTER — Emergency Department (HOSPITAL_COMMUNITY)
Admission: EM | Admit: 2020-01-10 | Discharge: 2020-01-10 | Disposition: A | Discharge status: Home / Self Care | Payer: Self-pay | Attending: Emergency Medicine | Admitting: Emergency Medicine

## 2020-01-10 ENCOUNTER — Encounter (HOSPITAL_COMMUNITY): Payer: Self-pay | Admitting: Emergency Medicine

## 2020-01-10 DIAGNOSIS — Z79899 Other long term (current) drug therapy: Secondary | ICD-10-CM | POA: Insufficient documentation

## 2020-01-10 DIAGNOSIS — M1990 Unspecified osteoarthritis, unspecified site: Secondary | ICD-10-CM | POA: Insufficient documentation

## 2020-01-10 MED ORDER — PREDNISONE 20 MG PO TABS
60.0000 mg | ORAL_TABLET | Freq: Once | ORAL | Status: AC
  Administered 2020-01-10: 60 mg via ORAL
  Filled 2020-01-10: qty 3

## 2020-01-10 MED ORDER — PREDNISONE 20 MG PO TABS: 20.0000 mg | ORAL_TABLET | Freq: Two times a day (BID) | ORAL | 0 refills | Status: DC

## 2020-01-10 MED ORDER — HYDROCODONE-ACETAMINOPHEN 5-325 MG PO TABS
1.0000 | ORAL_TABLET | Freq: Once | ORAL | Status: AC
  Administered 2020-01-10: 17:00:00 1 via ORAL
  Filled 2020-01-10: qty 1

## 2020-01-10 NOTE — ED Triage Notes (Signed)
Patient reports bilateral arm pain x3 days. Reports hx gout. Denies injury.

## 2020-01-10 NOTE — Discharge Instructions (Signed)
Try using heat on the sore areas 3-4 times a day.  Take Tylenol if needed for pain.  Use the prednisone as directed, to help the inflammation in the joints.  It will be helpful to find a primary care doctor to see for ongoing care and treatment.
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
## 2021-06-24 ENCOUNTER — Emergency Department (HOSPITAL_COMMUNITY)
Admission: EM | Admit: 2021-06-24 | Discharge: 2021-06-24 | Disposition: A | Discharge status: Home / Self Care | Payer: Medicaid Other | Attending: Emergency Medicine | Admitting: Emergency Medicine

## 2021-06-24 ENCOUNTER — Other Ambulatory Visit: Payer: Self-pay

## 2021-06-24 DIAGNOSIS — K0889 Other specified disorders of teeth and supporting structures: Secondary | ICD-10-CM | POA: Diagnosis present

## 2021-06-24 DIAGNOSIS — K029 Dental caries, unspecified: Secondary | ICD-10-CM | POA: Insufficient documentation

## 2021-06-24 MED ORDER — AMOXICILLIN 500 MG PO CAPS
500.0000 mg | ORAL_CAPSULE | Freq: Once | ORAL | Status: AC
  Administered 2021-06-24: 500 mg via ORAL
  Filled 2021-06-24: qty 1

## 2021-06-24 MED ORDER — CHLORHEXIDINE GLUCONATE 0.12 % MT SOLN: 15.0000 mL | Freq: Two times a day (BID) | OROMUCOSAL | 0 refills | Status: DC

## 2021-06-24 MED ORDER — IBUPROFEN 800 MG PO TABS
800.0000 mg | ORAL_TABLET | Freq: Once | ORAL | Status: AC
  Administered 2021-06-24: 800 mg via ORAL
  Filled 2021-06-24: qty 1

## 2021-06-24 MED ORDER — AMOXICILLIN 500 MG PO CAPS: 500.0000 mg | ORAL_CAPSULE | Freq: Two times a day (BID) | ORAL | 0 refills | Status: AC

## 2021-06-24 NOTE — ED Triage Notes (Signed)
Pt arrives via ems. Pt reports left sided dental pain and headache since yesterday. States he has a tooth that has broke off
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
## 2021-06-24 NOTE — Discharge Instructions (Addendum)
Take the antibiotics as well as ibuprofen as needed for pain.  Do not take Tylenol if you are still drinking alcohol. Use the mouth wash as prescribed  Return for new or worsening symptoms

## 2021-07-24 ENCOUNTER — Emergency Department (HOSPITAL_COMMUNITY)
Admission: EM | Admit: 2021-07-24 | Discharge: 2021-07-24 | Disposition: A | Discharge status: Home / Self Care | Payer: Medicaid Other | Attending: Emergency Medicine | Admitting: Emergency Medicine

## 2021-07-24 ENCOUNTER — Encounter (HOSPITAL_COMMUNITY): Payer: Self-pay | Admitting: Emergency Medicine

## 2021-07-24 DIAGNOSIS — M109 Gout, unspecified: Secondary | ICD-10-CM | POA: Insufficient documentation

## 2021-07-24 DIAGNOSIS — R000 Tachycardia, unspecified: Secondary | ICD-10-CM | POA: Diagnosis not present

## 2021-07-24 DIAGNOSIS — M25571 Pain in right ankle and joints of right foot: Secondary | ICD-10-CM | POA: Diagnosis present

## 2021-07-24 DIAGNOSIS — M1A09X0 Idiopathic chronic gout, multiple sites, without tophus (tophi): Secondary | ICD-10-CM

## 2021-07-24 MED ORDER — COLCHICINE 0.6 MG PO TABS: 0.6000 mg | ORAL_TABLET | Freq: Every day | ORAL | 0 refills | Status: DC

## 2021-07-24 MED ORDER — COLCHICINE 0.6 MG PO TABS
1.2000 mg | ORAL_TABLET | Freq: Once | ORAL | Status: AC
  Administered 2021-07-24: 1.2 mg via ORAL
  Filled 2021-07-24: qty 2

## 2021-07-24 NOTE — Discharge Instructions (Addendum)
You are being seen here today for a gout flareup.  You are prescribed colchicine to help with your symptoms.  Please follow instructions. Please follow with your primary care doctor for preventative care for this issue.  If you are unable to, a primary care office information is included in this discharge report.  Additional information on gout and a low purine diet included as well.  If you are gout does not improve, or you you have a fever, worsening pain, inability to move the joint, please return to the emergency department.

## 2021-07-24 NOTE — ED Triage Notes (Signed)
Per EMS-states he is complaining of body aches and a gout flare up-symptoms started 5 days-has been out of his meds-CBG 132
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
## 2021-07-25 ENCOUNTER — Ambulatory Visit: Payer: Self-pay | Admitting: *Deleted

## 2021-07-25 NOTE — Telephone Encounter (Signed)
Difficult call as pt and brother (on [place]) yelling  at each other throughout call. Brother states pt cannot get up, cannot ambulate. Went to ED yesterday, "Chronic gout." States  was given gout meds and sent home. Reports pt fell exiting ED.  States has not eaten or had anything to drink in 5-6 days, urine dark. States so weak "Can't stand up." Advised EMS. States will follow disposition.  No PCP      Reason for Disposition  [1] SEVERE weakness (i.e., unable to walk or barely able to walk, requires support) AND [2] new-onset or worsening  Answer Assessment - Initial Assessment Questions 1. DESCRIPTION: "Describe how you are feeling."     Please see summary. 2. SEVERITY: "How bad is it?"  "Can you stand and walk?"   - MILD - Feels weak or tired, but does not interfere with work, school or normal activities   - MODERATE - Able to stand and walk; weakness interferes with work, school, or normal activities   - SEVERE - Unable to stand or walk     *No Answer* 3. ONSET:  "When did the weakness begin?"     *No Answer* 4. CAUSE: "What do you think is causing the weakness?"     *No Answer* 5. MEDICINES: "Have you recently started a new medicine or had a change in the amount of a medicine?"     *No Answer* 6. OTHER SYMPTOMS: "Do you have any other symptoms?" (e.g., chest pain, fever, cough, SOB, vomiting, diarrhea, bleeding, other areas of pain)     *No Answer* 7. PREGNANCY: "Is there any chance you are pregnant?" "When was your last menstrual period?"     *No Answer*  Protocols used: Weakness (Generalized) and Fatigue-A-AH

## 2021-07-30 ENCOUNTER — Other Ambulatory Visit: Payer: Self-pay

## 2021-07-30 ENCOUNTER — Inpatient Hospital Stay (HOSPITAL_COMMUNITY)
Admission: EM | Admit: 2021-07-30 | Discharge: 2021-08-13 | DRG: 193 | Disposition: A | Discharge status: Home / Self Care | Payer: Medicaid Other | Attending: Internal Medicine | Admitting: Internal Medicine

## 2021-07-30 ENCOUNTER — Emergency Department (HOSPITAL_COMMUNITY): Payer: Medicaid Other

## 2021-07-30 ENCOUNTER — Encounter (HOSPITAL_COMMUNITY): Payer: Self-pay

## 2021-07-30 ENCOUNTER — Observation Stay (HOSPITAL_COMMUNITY): Payer: Medicaid Other

## 2021-07-30 DIAGNOSIS — Z6825 Body mass index (BMI) 25.0-25.9, adult: Secondary | ICD-10-CM

## 2021-07-30 DIAGNOSIS — R7982 Elevated C-reactive protein (CRP): Secondary | ICD-10-CM | POA: Diagnosis present

## 2021-07-30 DIAGNOSIS — R7989 Other specified abnormal findings of blood chemistry: Secondary | ICD-10-CM | POA: Diagnosis present

## 2021-07-30 DIAGNOSIS — F1020 Alcohol dependence, uncomplicated: Secondary | ICD-10-CM | POA: Diagnosis present

## 2021-07-30 DIAGNOSIS — J189 Pneumonia, unspecified organism: Principal | ICD-10-CM | POA: Diagnosis present

## 2021-07-30 DIAGNOSIS — L89312 Pressure ulcer of right buttock, stage 2: Secondary | ICD-10-CM | POA: Diagnosis present

## 2021-07-30 DIAGNOSIS — M109 Gout, unspecified: Secondary | ICD-10-CM | POA: Diagnosis present

## 2021-07-30 DIAGNOSIS — E871 Hypo-osmolality and hyponatremia: Secondary | ICD-10-CM | POA: Diagnosis present

## 2021-07-30 DIAGNOSIS — R52 Pain, unspecified: Secondary | ICD-10-CM

## 2021-07-30 DIAGNOSIS — E861 Hypovolemia: Secondary | ICD-10-CM | POA: Diagnosis present

## 2021-07-30 DIAGNOSIS — L89322 Pressure ulcer of left buttock, stage 2: Secondary | ICD-10-CM | POA: Diagnosis present

## 2021-07-30 DIAGNOSIS — K029 Dental caries, unspecified: Secondary | ICD-10-CM | POA: Diagnosis present

## 2021-07-30 DIAGNOSIS — R509 Fever, unspecified: Secondary | ICD-10-CM | POA: Diagnosis present

## 2021-07-30 DIAGNOSIS — E538 Deficiency of other specified B group vitamins: Secondary | ICD-10-CM | POA: Diagnosis present

## 2021-07-30 DIAGNOSIS — R6510 Systemic inflammatory response syndrome (SIRS) of non-infectious origin without acute organ dysfunction: Secondary | ICD-10-CM

## 2021-07-30 DIAGNOSIS — R531 Weakness: Secondary | ICD-10-CM

## 2021-07-30 DIAGNOSIS — G6289 Other specified polyneuropathies: Secondary | ICD-10-CM

## 2021-07-30 DIAGNOSIS — R7401 Elevation of levels of liver transaminase levels: Secondary | ICD-10-CM | POA: Diagnosis present

## 2021-07-30 DIAGNOSIS — L8990 Pressure ulcer of unspecified site, unspecified stage: Secondary | ICD-10-CM | POA: Insufficient documentation

## 2021-07-30 DIAGNOSIS — Z20822 Contact with and (suspected) exposure to covid-19: Secondary | ICD-10-CM | POA: Diagnosis present

## 2021-07-30 DIAGNOSIS — E43 Unspecified severe protein-calorie malnutrition: Secondary | ICD-10-CM | POA: Diagnosis present

## 2021-07-30 DIAGNOSIS — G629 Polyneuropathy, unspecified: Secondary | ICD-10-CM | POA: Diagnosis present

## 2021-07-30 DIAGNOSIS — Z79899 Other long term (current) drug therapy: Secondary | ICD-10-CM

## 2021-07-30 DIAGNOSIS — Z8739 Personal history of other diseases of the musculoskeletal system and connective tissue: Secondary | ICD-10-CM

## 2021-07-30 DIAGNOSIS — E278 Other specified disorders of adrenal gland: Secondary | ICD-10-CM | POA: Diagnosis present

## 2021-07-30 DIAGNOSIS — R5381 Other malaise: Secondary | ICD-10-CM | POA: Diagnosis present

## 2021-07-30 LAB — URINALYSIS, ROUTINE W REFLEX MICROSCOPIC
Bilirubin Urine: NEGATIVE
Glucose, UA: NEGATIVE mg/dL
Hgb urine dipstick: NEGATIVE
Ketones, ur: NEGATIVE mg/dL
Leukocytes,Ua: NEGATIVE
Nitrite: NEGATIVE
Protein, ur: NEGATIVE mg/dL
Specific Gravity, Urine: 1.021 (ref 1.005–1.030)
pH: 7 (ref 5.0–8.0)

## 2021-07-30 LAB — CBC WITH DIFFERENTIAL/PLATELET
Abs Immature Granulocytes: 0.05 10*3/uL (ref 0.00–0.07)
Basophils Absolute: 0.1 10*3/uL (ref 0.0–0.1)
Basophils Relative: 0 %
Eosinophils Absolute: 0 10*3/uL (ref 0.0–0.5)
Eosinophils Relative: 0 %
HCT: 42.9 % (ref 39.0–52.0)
Hemoglobin: 14.4 g/dL (ref 13.0–17.0)
Immature Granulocytes: 0 %
Lymphocytes Relative: 12 %
Lymphs Abs: 1.4 10*3/uL (ref 0.7–4.0)
MCH: 31.3 pg (ref 26.0–34.0)
MCHC: 33.6 g/dL (ref 30.0–36.0)
MCV: 93.3 fL (ref 80.0–100.0)
Monocytes Absolute: 1.1 10*3/uL — ABNORMAL HIGH (ref 0.1–1.0)
Monocytes Relative: 9 %
Neutro Abs: 9.7 10*3/uL — ABNORMAL HIGH (ref 1.7–7.7)
Neutrophils Relative %: 79 %
Platelets: 449 10*3/uL — ABNORMAL HIGH (ref 150–400)
RBC: 4.6 MIL/uL (ref 4.22–5.81)
RDW: 13.2 % (ref 11.5–15.5)
WBC: 12.4 10*3/uL — ABNORMAL HIGH (ref 4.0–10.5)
nRBC: 0 % (ref 0.0–0.2)

## 2021-07-30 LAB — PROTIME-INR
INR: 1.1 (ref 0.8–1.2)
Prothrombin Time: 14.7 seconds (ref 11.4–15.2)

## 2021-07-30 LAB — COMPREHENSIVE METABOLIC PANEL
ALT: 100 U/L — ABNORMAL HIGH (ref 0–44)
AST: 102 U/L — ABNORMAL HIGH (ref 15–41)
Albumin: 2.7 g/dL — ABNORMAL LOW (ref 3.5–5.0)
Alkaline Phosphatase: 353 U/L — ABNORMAL HIGH (ref 38–126)
Anion gap: 14 (ref 5–15)
BUN: 25 mg/dL — ABNORMAL HIGH (ref 6–20)
CO2: 19 mmol/L — ABNORMAL LOW (ref 22–32)
Calcium: 9.6 mg/dL (ref 8.9–10.3)
Chloride: 103 mmol/L (ref 98–111)
Creatinine, Ser: 0.99 mg/dL (ref 0.61–1.24)
GFR, Estimated: >60 mL/min (ref >60)
Glucose, Bld: 108 mg/dL — ABNORMAL HIGH (ref 70–99)
Potassium: 4.2 mmol/L (ref 3.5–5.1)
Sodium: 136 mmol/L (ref 135–145)
Total Bilirubin: 2.4 mg/dL — ABNORMAL HIGH (ref 0.3–1.2)
Total Protein: 7.6 g/dL (ref 6.5–8.1)

## 2021-07-30 LAB — CK: Total CK: 157 U/L (ref 49–397)

## 2021-07-30 LAB — I-STAT CHEM 8, ED
BUN: 24 mg/dL — ABNORMAL HIGH (ref 6–20)
Calcium, Ion: 1.18 mmol/L (ref 1.15–1.40)
Chloride: 106 mmol/L (ref 98–111)
Creatinine, Ser: 0.8 mg/dL (ref 0.61–1.24)
Glucose, Bld: 118 mg/dL — ABNORMAL HIGH (ref 70–99)
HCT: 37 % — ABNORMAL LOW (ref 39.0–52.0)
Hemoglobin: 12.6 g/dL — ABNORMAL LOW (ref 13.0–17.0)
Potassium: 4 mmol/L (ref 3.5–5.1)
Sodium: 138 mmol/L (ref 135–145)
TCO2: 25 mmol/L (ref 22–32)

## 2021-07-30 LAB — TROPONIN I (HIGH SENSITIVITY)
Troponin I (High Sensitivity): 15 ng/L (ref <18)
Troponin I (High Sensitivity): 19 ng/L — ABNORMAL HIGH (ref <18)

## 2021-07-30 LAB — LIPASE, BLOOD: Lipase: 30 U/L (ref 11–51)

## 2021-07-30 LAB — APTT: aPTT: 29 seconds (ref 24–36)

## 2021-07-30 LAB — RESP PANEL BY RT-PCR (FLU A&B, COVID) ARPGX2
Influenza A by PCR: NEGATIVE
Influenza B by PCR: NEGATIVE
SARS Coronavirus 2 by RT PCR: NEGATIVE

## 2021-07-30 LAB — MAGNESIUM: Magnesium: 2.5 mg/dL — ABNORMAL HIGH (ref 1.7–2.4)

## 2021-07-30 LAB — LACTIC ACID, PLASMA: Lactic Acid, Venous: 1.6 mmol/L (ref 0.5–1.9)

## 2021-07-30 MED ORDER — IOHEXOL 300 MG/ML  SOLN
100.0000 mL | Freq: Once | INTRAMUSCULAR | Status: AC | PRN
  Administered 2021-07-30: 100 mL via INTRAVENOUS

## 2021-07-30 MED ORDER — ACETAMINOPHEN 325 MG PO TABS
650.0000 mg | ORAL_TABLET | Freq: Four times a day (QID) | ORAL | Status: DC | PRN
  Administered 2021-07-31 – 2021-08-04 (×7): 650 mg via ORAL
  Filled 2021-07-30 (×8): qty 2

## 2021-07-30 MED ORDER — ACETAMINOPHEN 650 MG RE SUPP: 650.0000 mg | Freq: Four times a day (QID) | RECTAL | Status: DC | PRN

## 2021-07-30 MED ORDER — COLCHICINE 0.6 MG PO TABS
0.6000 mg | ORAL_TABLET | Freq: Every day | ORAL | Status: DC
  Administered 2021-07-31 – 2021-08-13 (×14): 0.6 mg via ORAL
  Filled 2021-07-30 (×15): qty 1

## 2021-07-30 MED ORDER — ONDANSETRON HCL 4 MG PO TABS: 4.0000 mg | ORAL_TABLET | Freq: Four times a day (QID) | ORAL | Status: DC | PRN

## 2021-07-30 MED ORDER — FOLIC ACID 1 MG PO TABS
1.0000 mg | ORAL_TABLET | Freq: Every day | ORAL | Status: DC
  Administered 2021-07-31 – 2021-08-13 (×14): 1 mg via ORAL
  Filled 2021-07-30 (×14): qty 1

## 2021-07-30 MED ORDER — LORAZEPAM 2 MG/ML IJ SOLN: 1.0000 mg | INTRAMUSCULAR | Status: AC | PRN

## 2021-07-30 MED ORDER — ACETAMINOPHEN 325 MG PO TABS
650.0000 mg | ORAL_TABLET | Freq: Once | ORAL | Status: AC
  Administered 2021-07-30: 650 mg via ORAL
  Filled 2021-07-30: qty 2

## 2021-07-30 MED ORDER — PANTOPRAZOLE SODIUM 40 MG PO TBEC
40.0000 mg | DELAYED_RELEASE_TABLET | Freq: Every day | ORAL | Status: DC
  Administered 2021-07-31 – 2021-08-08 (×9): 40 mg via ORAL
  Filled 2021-07-30 (×9): qty 1

## 2021-07-30 MED ORDER — THIAMINE HCL 100 MG PO TABS
100.0000 mg | ORAL_TABLET | Freq: Every day | ORAL | Status: DC
  Administered 2021-07-31 – 2021-08-13 (×14): 100 mg via ORAL
  Filled 2021-07-30 (×14): qty 1

## 2021-07-30 MED ORDER — LACTATED RINGERS IV BOLUS (SEPSIS)
1000.0000 mL | Freq: Once | INTRAVENOUS | Status: AC
  Administered 2021-07-30: 1000 mL via INTRAVENOUS

## 2021-07-30 MED ORDER — ONDANSETRON HCL 4 MG/2ML IJ SOLN: 4.0000 mg | Freq: Four times a day (QID) | INTRAMUSCULAR | Status: DC | PRN

## 2021-07-30 MED ORDER — THIAMINE HCL 100 MG/ML IJ SOLN
100.0000 mg | Freq: Every day | INTRAMUSCULAR | Status: DC
  Filled 2021-07-30 (×3): qty 2

## 2021-07-30 MED ORDER — LACTATED RINGERS IV SOLN
INTRAVENOUS | Status: DC
Start: 2021-07-30 — End: 2021-08-01

## 2021-07-30 MED ORDER — ENOXAPARIN SODIUM 40 MG/0.4ML IJ SOSY
40.0000 mg | PREFILLED_SYRINGE | INTRAMUSCULAR | Status: DC
  Administered 2021-07-31 – 2021-08-13 (×14): 40 mg via SUBCUTANEOUS
  Filled 2021-07-30 (×14): qty 0.4

## 2021-07-30 MED ORDER — ADULT MULTIVITAMIN W/MINERALS CH
1.0000 | ORAL_TABLET | Freq: Every day | ORAL | Status: DC
  Administered 2021-07-31 – 2021-08-13 (×14): 1 via ORAL
  Filled 2021-07-30 (×14): qty 1

## 2021-07-30 MED ORDER — LORAZEPAM 1 MG PO TABS: 1.0000 mg | ORAL_TABLET | ORAL | Status: AC | PRN

## 2021-07-30 NOTE — ED Triage Notes (Signed)
Patient reports generalized weakness, BIL leg pain, BIL wrist pain x3 weeks. Patient was seen at [facility] ED last week for gout flare. Since then has been laying on couch urinating on self. Generalized body pain

## 2021-07-30 NOTE — ED Notes (Signed)
Patient cleaned and changed into gown.
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
## 2021-07-30 NOTE — ED Notes (Signed)
Darral Dash [facility] nurse [phone] requesting to speak to someone about patient

## 2021-07-31 ENCOUNTER — Encounter (HOSPITAL_COMMUNITY): Payer: Self-pay | Admitting: Internal Medicine

## 2021-07-31 DIAGNOSIS — R7982 Elevated C-reactive protein (CRP): Secondary | ICD-10-CM | POA: Diagnosis present

## 2021-07-31 DIAGNOSIS — E43 Unspecified severe protein-calorie malnutrition: Secondary | ICD-10-CM | POA: Diagnosis present

## 2021-07-31 DIAGNOSIS — R531 Weakness: Secondary | ICD-10-CM | POA: Diagnosis not present

## 2021-07-31 DIAGNOSIS — R7401 Elevation of levels of liver transaminase levels: Secondary | ICD-10-CM | POA: Diagnosis present

## 2021-07-31 DIAGNOSIS — Z20822 Contact with and (suspected) exposure to covid-19: Secondary | ICD-10-CM | POA: Diagnosis present

## 2021-07-31 DIAGNOSIS — R5381 Other malaise: Secondary | ICD-10-CM | POA: Diagnosis present

## 2021-07-31 DIAGNOSIS — E861 Hypovolemia: Secondary | ICD-10-CM | POA: Diagnosis present

## 2021-07-31 DIAGNOSIS — E538 Deficiency of other specified B group vitamins: Secondary | ICD-10-CM | POA: Diagnosis present

## 2021-07-31 DIAGNOSIS — M109 Gout, unspecified: Secondary | ICD-10-CM | POA: Diagnosis present

## 2021-07-31 DIAGNOSIS — R6510 Systemic inflammatory response syndrome (SIRS) of non-infectious origin without acute organ dysfunction: Secondary | ICD-10-CM | POA: Diagnosis present

## 2021-07-31 DIAGNOSIS — F1020 Alcohol dependence, uncomplicated: Secondary | ICD-10-CM

## 2021-07-31 DIAGNOSIS — R7989 Other specified abnormal findings of blood chemistry: Secondary | ICD-10-CM

## 2021-07-31 DIAGNOSIS — G6289 Other specified polyneuropathies: Secondary | ICD-10-CM | POA: Diagnosis not present

## 2021-07-31 DIAGNOSIS — R509 Fever, unspecified: Secondary | ICD-10-CM | POA: Diagnosis not present

## 2021-07-31 DIAGNOSIS — Z79899 Other long term (current) drug therapy: Secondary | ICD-10-CM | POA: Diagnosis not present

## 2021-07-31 DIAGNOSIS — L89322 Pressure ulcer of left buttock, stage 2: Secondary | ICD-10-CM | POA: Diagnosis present

## 2021-07-31 DIAGNOSIS — E278 Other specified disorders of adrenal gland: Secondary | ICD-10-CM | POA: Diagnosis present

## 2021-07-31 DIAGNOSIS — J189 Pneumonia, unspecified organism: Secondary | ICD-10-CM | POA: Diagnosis present

## 2021-07-31 DIAGNOSIS — Z6825 Body mass index (BMI) 25.0-25.9, adult: Secondary | ICD-10-CM | POA: Diagnosis not present

## 2021-07-31 DIAGNOSIS — K029 Dental caries, unspecified: Secondary | ICD-10-CM | POA: Diagnosis present

## 2021-07-31 DIAGNOSIS — E871 Hypo-osmolality and hyponatremia: Secondary | ICD-10-CM | POA: Diagnosis present

## 2021-07-31 DIAGNOSIS — L89159 Pressure ulcer of sacral region, unspecified stage: Secondary | ICD-10-CM

## 2021-07-31 DIAGNOSIS — G629 Polyneuropathy, unspecified: Secondary | ICD-10-CM | POA: Diagnosis present

## 2021-07-31 DIAGNOSIS — L89312 Pressure ulcer of right buttock, stage 2: Secondary | ICD-10-CM | POA: Diagnosis present

## 2021-07-31 LAB — CBC
HCT: 41.5 % (ref 39.0–52.0)
Hemoglobin: 12.9 g/dL — ABNORMAL LOW (ref 13.0–17.0)
MCH: 30.9 pg (ref 26.0–34.0)
MCHC: 31.1 g/dL (ref 30.0–36.0)
MCV: 99.5 fL (ref 80.0–100.0)
Platelets: 361 10*3/uL (ref 150–400)
RBC: 4.17 MIL/uL — ABNORMAL LOW (ref 4.22–5.81)
RDW: 13.2 % (ref 11.5–15.5)
WBC: 9.6 10*3/uL (ref 4.0–10.5)
nRBC: 0 % (ref 0.0–0.2)

## 2021-07-31 LAB — COMPREHENSIVE METABOLIC PANEL
ALT: 84 U/L — ABNORMAL HIGH (ref 0–44)
AST: 73 U/L — ABNORMAL HIGH (ref 15–41)
Albumin: 2.4 g/dL — ABNORMAL LOW (ref 3.5–5.0)
Alkaline Phosphatase: 296 U/L — ABNORMAL HIGH (ref 38–126)
Anion gap: 9 (ref 5–15)
BUN: 23 mg/dL — ABNORMAL HIGH (ref 6–20)
CO2: 21 mmol/L — ABNORMAL LOW (ref 22–32)
Calcium: 9.2 mg/dL (ref 8.9–10.3)
Chloride: 106 mmol/L (ref 98–111)
Creatinine, Ser: 0.97 mg/dL (ref 0.61–1.24)
GFR, Estimated: >60 mL/min (ref >60)
Glucose, Bld: 109 mg/dL — ABNORMAL HIGH (ref 70–99)
Potassium: 4.1 mmol/L (ref 3.5–5.1)
Sodium: 136 mmol/L (ref 135–145)
Total Bilirubin: 2 mg/dL — ABNORMAL HIGH (ref 0.3–1.2)
Total Protein: 6.7 g/dL (ref 6.5–8.1)

## 2021-07-31 LAB — URINE CULTURE

## 2021-07-31 LAB — HEPATITIS PANEL, ACUTE
HCV Ab: NONREACTIVE
Hep A IgM: NONREACTIVE
Hep B C IgM: NONREACTIVE
Hepatitis B Surface Ag: NONREACTIVE

## 2021-07-31 LAB — AMMONIA: Ammonia: 42 umol/L — ABNORMAL HIGH (ref 9–35)

## 2021-07-31 LAB — PROCALCITONIN: Procalcitonin: 0.72 ng/mL

## 2021-07-31 LAB — ETHANOL: Alcohol, Ethyl (B): <10 mg/dL (ref <10)

## 2021-07-31 LAB — HIV ANTIBODY (ROUTINE TESTING W REFLEX): HIV Screen 4th Generation wRfx: NONREACTIVE

## 2021-07-31 LAB — URIC ACID: Uric Acid, Serum: 6.4 mg/dL (ref 3.7–8.6)

## 2021-07-31 MED ORDER — SODIUM CHLORIDE 0.9 % IV SOLN
1.0000 g | INTRAVENOUS | Status: DC
  Administered 2021-07-31 – 2021-08-04 (×5): 1 g via INTRAVENOUS
  Filled 2021-07-31 (×5): qty 10

## 2021-07-31 MED ORDER — DOXYCYCLINE HYCLATE 100 MG PO TABS
100.0000 mg | ORAL_TABLET | Freq: Two times a day (BID) | ORAL | Status: AC
  Administered 2021-07-31 – 2021-08-04 (×10): 100 mg via ORAL
  Filled 2021-07-31 (×10): qty 1

## 2021-07-31 MED ORDER — GERHARDT'S BUTT CREAM
TOPICAL_CREAM | Freq: Four times a day (QID) | CUTANEOUS | Status: DC
  Administered 2021-08-01 – 2021-08-13 (×4): 1 via TOPICAL
  Filled 2021-07-31 (×3): qty 1

## 2021-07-31 NOTE — Consult Note (Signed)
WOC Nurse Consult Note: Reason for Consult: skin breakdown at buttocks near gluteal cleft.  Right buttock near gluteal cleft > left buttock near gluteal cleft. Partial thicknesses skin loss is evolving into pressure injury at proximal margin Wound type: moisture associated skin damage, specifically irritant contact dermatitis secondary to fecal and urinary incontinence and intertriginous dermatitis of the gluteal cleft  ICD-10 CM Codes for Irritant Dermatitis  L24A2 - Due to fecal, urinary or dual incontinence L30.4  - Erythema intertrigo. Also used for abrasion of the hand, chafing of the skin, dermatitis due to sweating and friction, friction dermatitis, friction eczema, and genital/thigh intertrigo.   Pressure Injury POA: Yes Measurement: right buttock measures 10cm x 4cm area of partial thickness skin loss with 4cm x 3cm area at apex of injury that is darkly pigmented and consistent with presentation of Unstageable pressure injury Left buttock with 7cm x 3cm area of partial thickness skin loss. Wound GYJ:EHUD, pink, moist Drainage (amount, consistency, odor) scant serous Periwound: with dried stool Dressing procedure/placement/frequency: Turning and repositioning will be the cornerstone of this POC and time in the supine position should be minimized. A mattress replacement will be provided as patient is at high risk for continued skin breakdown. Nutrition is poor, mobility is diminished.    Topical care will be with Gerhart's Butt Cream, a compounded prescriptive consisting of 1:1:1 hydrocortisone cream:lotrimin:zinc oxide applied 4 times a day and PRN soiling. Heel boots are provided for the floatation of heels and to prevent lateral rotation. A prophylactic foam dressing is to be used for sacral pressure injury prevention.  Consultation with a Registered dietician is recommended.  If you agree, please order/arrange.  [facility]OC nursing team will not follow, but will remain available to this  patient, the nursing and medical teams.  Please re-consult if needed. Thanks, Ladona Mow, MSN, RN, GNP, [facility]  Pager# [phone]
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
## 2021-07-31 NOTE — Plan of Care (Signed)
  Problem: Education: Goal: Knowledge of General Education information will improve Description Including pain rating scale, medication(s)/side effects and non-pharmacologic comfort measures Outcome: Progressing   Problem: Health Behavior/Discharge Planning: Goal: Ability to manage health-related needs will improve Outcome: Progressing   

## 2021-07-31 NOTE — ED Notes (Signed)
Linen change, pt cleaned of stool, cream applied to posterior. Tolerates well and rolls with minimal assistance.

## 2021-07-31 NOTE — ED Notes (Signed)
Sister Lindwood Coke and given update.

## 2021-07-31 NOTE — ED Notes (Signed)
PT at bedside.

## 2021-07-31 NOTE — ED Notes (Signed)
Breakfast Orders placed 
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
## 2021-07-31 NOTE — Evaluation (Addendum)
Occupational Therapy Evaluation Patient Details Name: William [name] MRN: [id number] DOB: 02-27-1966 Today's Date: 07/31/2021   History of Present Illness 55 yo male presenting generalized weakness and bilateral wrist and leg pain. Diagnosed with gout flare; started on colchicine. PMH including EtOH abuse, cholecystitis, liver failure (2018), and gout.   Clinical Impression   PTA, pt was living "four other guys" and was independent until recent gout flare. Pt currently requiring Min A for UB ADLs, Max A for LB ADLs, and Max A +2 for sit<>stand with RW. Pt presenting with weakness and limited activity tolerance due to pain.  Pt would benefit from further acute OT to facilitate safe dc. Recommend dc to SNF for further OT to optimize safety, independence with ADLs, and return to PLOF.      Recommendations for follow up therapy are one component of a multi-disciplinary discharge planning process, led by the attending physician.  Recommendations may be updated based on patient status, additional functional criteria and insurance authorization.   Follow Up Recommendations  SNF    Equipment Recommendations  Other (comment) (Defer to next venue)    Recommendations for Other Services PT consult     Precautions / Restrictions Precautions Precautions: Fall      Mobility Bed Mobility Overal bed mobility: Needs Assistance Bed Mobility: Supine to Sit;Sit to Supine     Supine to sit: Max assist;+2 for physical assistance;+2 for safety/equipment Sit to supine: Max assist;+2 for physical assistance;+2 for safety/equipment   General bed mobility comments: Max A +2 for trunk support and managing BLEs    Transfers Overall transfer level: Needs assistance Equipment used: 2 person hand held assist Transfers: Sit to/from Stand Sit to Stand: Max assist;+2 physical assistance         General transfer comment: max A +2 for power up; clearing bottom off bed but unable to achieve full  standing    Balance Overall balance assessment: Needs assistance Sitting-balance support: No upper extremity supported;Feet supported Sitting balance-Leahy Scale: Fair     Standing balance support: Bilateral upper extremity supported;During functional activity Standing balance-Leahy Scale: Poor                             ADL either performed or assessed with clinical judgement   ADL Overall ADL's : Needs assistance/impaired Eating/Feeding: Set up;Supervision/ safety;Bed level   Grooming: Set up;Supervision/safety;Bed level   Upper Body Bathing: Minimal assistance;Sitting   Lower Body Bathing: Maximal assistance;Bed level;Sit to/from stand   Upper Body Dressing : Minimal assistance;Sitting   Lower Body Dressing: Maximal assistance;Bed level Lower Body Dressing Details (indicate cue type and reason): don socks             Functional mobility during ADLs: Maximal assistance;+2 for physical assistance (sit<>stand; semi standing) General ADL Comments: Pt presenting with fatigue, poor strength, and limited actiivty tolerance due to pain     Vision         Perception     Praxis      Pertinent Vitals/Pain Pain Assessment: Faces Faces Pain Scale: Hurts even more Pain Location: BLEs Pain Descriptors / Indicators: Constant;Discomfort;Grimacing;Guarding Pain Intervention(s): Monitored during session;Limited activity within patient's tolerance;Repositioned     Hand Dominance Right   Extremity/Trunk Assessment Upper Extremity Assessment Upper Extremity Assessment: Overall WFL for tasks assessed   Lower Extremity Assessment Lower Extremity Assessment: Defer to PT evaluation   Cervical / Trunk Assessment Cervical / Trunk Assessment: Kyphotic   Communication Communication  Communication: Expressive difficulties (slurred speech)   Cognition Arousal/Alertness: Lethargic Behavior During Therapy: Flat affect Overall Cognitive Status: Impaired/Different  from baseline Area of Impairment: Attention;Memory;Following commands;Safety/judgement;Awareness;Problem solving                   Current Attention Level: Selective Memory: Decreased short-term memory Following Commands: Follows one step commands with increased time;Follows one step commands inconsistently Safety/Judgement: Decreased awareness of deficits;Decreased awareness of safety Awareness: Intellectual;Emergent Problem Solving: Slow processing;Requires verbal cues;Requires tactile cues General Comments: Pt with flat affect and requiring increased time throughout. Pt benefiting from increased time and encouragement to participate in mobility.   General Comments  VSS on RA    Exercises     Shoulder Instructions      Home Living Family/patient expects to be discharged to:: Private residence Living Arrangements: Non-relatives/Friends;Other (Comment) (3 other room mates) Available Help at Discharge: Available PRN/intermittently;Friend(s) Type of Home: Other(Comment) (trailer) Home Access: Stairs to enter [employer] of Steps: "quite a few" unsure of exact # Entrance Stairs-Rails: None (was supposed to put on hand rails but has not happened yet) Home Layout: One level     Bathroom Shower/Tub: Chief Strategy Officer: Standard     Home Equipment: Crutches   Additional Comments: does not use crutches all the time- only during gout flares; independent with dressing, grocery shops with room mate who drives; R handed; "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      Prior Functioning/Environment Level of Independence: Independent                 OT Problem List: Decreased strength;Decreased range of motion;Decreased activity tolerance;Impaired balance (sitting and/or standing);Decreased safety awareness;Decreased knowledge of precautions;Decreased knowledge of use of DME or AE;Pain      OT Treatment/Interventions: Self-care/ADL  training;Therapeutic exercise;Energy conservation;DME and/or AE instruction;Therapeutic activities;Patient/family education    OT Goals(Current goals can be found in the care plan section) Acute Rehab OT Goals Patient Stated Goal: Stop pain OT Goal Formulation: With patient Time For Goal Achievement: 08/14/21 Potential to Achieve Goals: Good  OT Frequency: Min 2X/week   Barriers to D/C:            Co-evaluation              AM-PAC OT "6 Clicks" Daily Activity     Outcome Measure Help from another person eating meals?: A Little Help from another person taking care of personal grooming?: A Little Help from another person toileting, which includes using toliet, bedpan, or urinal?: A Lot Help from another person bathing (including washing, rinsing, drying)?: A Lot Help from another person to put on and taking off regular upper body clothing?: A Little Help from another person to put on and taking off regular lower body clothing?: A Lot 6 Click Score: 15   End of Session Equipment Utilized During Treatment: Gait belt;Rolling walker Nurse Communication: Mobility status;Other (comment) (Blood in IV)  Activity Tolerance: Patient tolerated treatment well Patient left: in bed;with call bell/phone within reach  OT Visit Diagnosis: Unsteadiness on feet (R26.81);Other abnormalities of gait and mobility (R26.89);Muscle weakness (generalized) (M62.81);Pain Pain - Right/Left: Right (R<L) Pain - part of body: Leg;Ankle and joints of foot;Knee                Time: 9326-7124 OT Time Calculation (min): 15 min Charges:  OT General Charges $OT Visit: 1 Visit OT Evaluation $OT Eval Moderate Complexity: 1 Mod  Cheronda Erck MSOT, OTR/L Acute Rehab Pager:  [phone] Office: [phone]  Theodoro Grist Tahjay Binion 07/31/2021, 11:48 AM

## 2021-07-31 NOTE — Progress Notes (Addendum)
PROGRESS NOTE  William [name] EXH:[id number] DOB: 08-May-1966 DOA: 07/30/2021 PCP: Patient, No Pcp Per (Inactive)  HPI/Recap of past 24 hours:  William [name] is a 55 y.o. male with medical history significant of EtOH abuse, gout.   Pt seen in ED x6 days ago with pain in B wrists and legs, diagnosed with gout flare, started on colchicine.   He was seen by visiting nurse today and found to be generally weak, has not been off the couch for a while, urinating and stooling self.  Also has reported decubitus ulcer on backside.  Complaining of generalized pain generally involving both legs both wrists.   Symptoms constant, severe, worsening.  Nothing makes better or worse.   Has been able to eat and drink.   Tells me and EDP today that last EtOH use was several months ago.  07/31/2021: Patient was seen and examined at his bedside.  Reports he has been coughing intermittently for the past few days.  Reviewed imagings, including CT scan, could not completely rule out community-acquired pneumonia.  Procalcitonin elevated Will treat empirically with Rocephin and p.o. doxycycline for now.  Repeating procalcitonin in the morning.  Monitor fever curve and WBC.    Assessment/Plan: Principal Problem:   SIRS (systemic inflammatory response syndrome) (HCC) Active Problems:   History of gout   Alcoholism (HCC)   Elevated LFTs   Generalized weakness   Pressure injury of skin  SIRS , could not rule out CAP, concern for developing CAP, poa- IVF: 1L bolus then LR at 125 Could not rule out CAP Procalcitonin elevated greater than 0.7. Obtain MRSA screening test. Start Rocephin and p.o. doxycycline. Lactate nl, BP nl BCx negative to date Covid and flu neg Personally reviewed chest x-ray and CT scanning could not rule out developing CAP. Gout, with acute flare - Cont colchicine Check uric acid, pending EtOH abuse -   Continue CIWA protocol Continue MVM, Thiamine and folic acid supplements EtOH  level negative UDS pending LFT elevation - RUQ [place] and CT AP = no acute findings, gallstones seen on RUQ [place] Repeat CMP in AM Will check hepatitis acute panel Pressure injury - wound care consulted, continue local wound care   DVT prophylaxis: Lovenox SQ daily Code Status: Full Family Communication: No family in room Disposition Plan: Home after admit Consults called: None Admission status: Place in obs    Procedures: None   Antimicrobials: Rocephin 07/31/21 Po Doxycycline 07/31/21   Status is: Observation         Objective: Vitals:   07/31/21 1000 07/31/21 1100 07/31/21 1128 07/31/21 1200  BP: 114/83 101/76  115/79  Pulse: 86 76  78  Resp: (!) 21 (!) 23  (!) 30  Temp:   97.6 F (36.4 C)   TempSrc:   Oral   SpO2: 100% 98%  95%  Weight:      Height:        Intake/Output Summary (Last 24 hours) at 07/31/2021 1310 Last data filed at 07/30/2021 1837 Gross per 24 hour  Intake 1000 ml  Output --  Net 1000 ml   Filed Weights   07/30/21 1639  Weight: 81.6 kg    Exam:  General: 55 y.o. year-old male well developed well nourished in no acute distress.  Somnolent but easily arousable to voices.  Answers questions appropriately. Cardiovascular: Regular rate and rhythm with no rubs or gallops.  No thyromegaly or JVD noted.   Respiratory: Clear to auscultation with no wheezes or rales. Good  inspiratory effort. Abdomen: Soft nontender nondistended with normal bowel sounds x4 quadrants. Musculoskeletal: Trace lower extremity edema. 2/4 pulses in all 4 extremities. Skin: No ulcerative lesions noted or rashes, Psychiatry: Mood is appropriate for condition and setting   Data Reviewed: CBC: Recent Labs  Lab 07/30/21 1711 07/30/21 1937 07/31/21 0010  WBC 12.4*  --  9.6  NEUTROABS 9.7*  --   --   HGB 14.4 12.6* 12.9*  HCT 42.9 37.0* 41.5  MCV 93.3  --  99.5  PLT 449*  --  361   Basic Metabolic Panel: Recent Labs  Lab 07/30/21 1711 07/30/21 1937  07/31/21 0010  NA 136 138 136  K 4.2 4.0 4.1  CL 103 106 106  CO2 19*  --  21*  GLUCOSE 108* 118* 109*  BUN 25* 24* 23*  CREATININE 0.99 0.80 0.97  CALCIUM 9.6  --  9.2  MG 2.5*  --   --    GFR: Estimated Creatinine Clearance: 91.6 mL/min (by C-G formula based on SCr of 0.97 mg/dL). Liver Function Tests: Recent Labs  Lab 07/30/21 1711 07/31/21 0010  AST 102* 73*  ALT 100* 84*  ALKPHOS 353* 296*  BILITOT 2.4* 2.0*  PROT 7.6 6.7  ALBUMIN 2.7* 2.4*   Recent Labs  Lab 07/30/21 1711  LIPASE 30   Recent Labs  Lab 07/30/21 2348  AMMONIA 42*   Coagulation Profile: Recent Labs  Lab 07/30/21 1711  INR 1.1   Cardiac Enzymes: Recent Labs  Lab 07/30/21 1711  CKTOTAL 157   BNP (last 3 results) No results for input(s): PROBNP in the last 8760 hours. HbA1C: No results for input(s): HGBA1C in the last 72 hours. CBG: No results for input(s): GLUCAP in the last 168 hours. Lipid Profile: No results for input(s): CHOL, HDL, LDLCALC, TRIG, CHOLHDL, LDLDIRECT in the last 72 hours. Thyroid Function Tests: No results for input(s): TSH, T4TOTAL, FREET4, T3FREE, THYROIDAB in the last 72 hours. Anemia Panel: No results for input(s): VITAMINB12, FOLATE, FERRITIN, TIBC, IRON, RETICCTPCT in the last 72 hours. Urine analysis:    Component Value Date/Time   COLORURINE AMBER (A) 07/30/2021 1653   APPEARANCEUR CLEAR 07/30/2021 1653   LABSPEC 1.021 07/30/2021 1653   PHURINE 7.0 07/30/2021 1653   GLUCOSEU NEGATIVE 07/30/2021 1653   HGBUR NEGATIVE 07/30/2021 1653   BILIRUBINUR NEGATIVE 07/30/2021 1653   KETONESUR NEGATIVE 07/30/2021 1653   PROTEINUR NEGATIVE 07/30/2021 1653   UROBILINOGEN 0.2 01/26/2015 0903   NITRITE NEGATIVE 07/30/2021 1653   LEUKOCYTESUR NEGATIVE 07/30/2021 1653   Sepsis Labs: @LABRCNTIP (procalcitonin:4,lacticidven:4)  ) Recent Results (from the past 240 hour(s))  Blood Culture (routine x 2)     Status: None (Preliminary result)   Collection Time:  07/30/21  5:11 PM   Specimen: BLOOD LEFT ARM  Result Value Ref Range Status   Specimen Description BLOOD LEFT ARM  Final   Special Requests   Final    BOTTLES DRAWN AEROBIC AND ANAEROBIC Blood Culture results may not be optimal due to an excessive volume of blood received in culture bottles   Culture   Final    NO GROWTH < 24 HOURS Performed at [facility], [address] [place], [place]    Report Status PENDING  Incomplete  Resp Panel by RT-PCR (Flu A&B, Covid) In/Out Cath Urine     Status: None   Collection Time: 07/30/21  5:50 PM   Specimen: In/Out Cath Urine; Nasopharyngeal(NP) swabs in vial transport medium  Result Value Ref Range  Status   SARS Coronavirus 2 by RT PCR NEGATIVE NEGATIVE Final    Comment: (NOTE) SARS-CoV-2 target nucleic acids are NOT DETECTED.  The SARS-CoV-2 RNA is generally detectable in upper respiratory specimens during the acute phase of infection. The lowest concentration of SARS-CoV-2 viral copies this assay can detect is 138 copies/mL. A negative result does not preclude SARS-Cov-2 infection and should not be used as the sole basis for treatment or other patient management decisions. A negative result may occur with  improper specimen collection/handling, submission of specimen other than nasopharyngeal swab, presence of viral mutation(s) within the areas targeted by this assay, and inadequate number of viral copies(<138 copies/mL). A negative result must be combined with clinical observations, patient history, and epidemiological information. The expected result is Negative.  Fact Sheet for Patients:  BloggerCourse.com  Fact Sheet for Healthcare Providers:  SeriousBroker.it  This test is no t yet approved or cleared by the [place] FDA and  has been authorized for detection and/or diagnosis of SARS-CoV-2 by FDA under an Emergency Use Authorization (EUA). This EUA will remain   in effect (meaning this test can be used) for the duration of the COVID-19 declaration under Section 564(b)(1) of the Act, 21 U.S.C.section 360bbb-3(b)(1), unless the authorization is terminated  or revoked sooner.       Influenza A by PCR NEGATIVE NEGATIVE Final   Influenza B by PCR NEGATIVE NEGATIVE Final    Comment: (NOTE) The Xpert Xpress SARS-CoV-2/FLU/RSV plus assay is intended as an aid in the diagnosis of influenza from Nasopharyngeal swab specimens and should not be used as a sole basis for treatment. Nasal washings and aspirates are unacceptable for Xpert Xpress SARS-CoV-2/FLU/RSV testing.  Fact Sheet for Patients: BloggerCourse.com  Fact Sheet for Healthcare Providers: SeriousBroker.it  This test is not yet approved or cleared by the [place] FDA and has been authorized for detection and/or diagnosis of SARS-CoV-2 by FDA under an Emergency Use Authorization (EUA). This EUA will remain in effect (meaning this test can be used) for the duration of the COVID-19 declaration under Section 564(b)(1) of the Act, 21 U.S.C. section 360bbb-3(b)(1), unless the authorization is terminated or revoked.  Performed at [facility], [address] [place], [place] [address]   Blood Culture (routine x 2)     Status: None (Preliminary result)   Collection Time: 07/30/21 10:31 PM   Specimen: BLOOD  Result Value Ref Range Status   Specimen Description BLOOD BLOOD RIGHT HAND  Final   Special Requests   Final    BOTTLES DRAWN AEROBIC AND ANAEROBIC Blood Culture adequate volume   Culture   Final    NO GROWTH < 12 HOURS Performed at [facility], [address], [place], [place] [address]    Report Status PENDING  Incomplete      Studies: CT Abdomen Pelvis W Contrast  Result Date: 07/30/2021 CLINICAL DATA:  Abdominal pain EXAM: CT ABDOMEN AND PELVIS WITH CONTRAST TECHNIQUE: Multidetector CT imaging of the  abdomen and pelvis was performed using the standard protocol following bolus administration of intravenous contrast. CONTRAST:  OMNIPAQUE IOHEXOL 300 MG/ML  SOLN COMPARISON:  None. FINDINGS: Lower chest: Lung bases are clear. No effusions. Heart is normal size. Hepatobiliary: No focal hepatic abnormality. Gallbladder unremarkable. Pancreas: No focal abnormality or ductal dilatation. Spleen: No focal abnormality.  Normal size. Adrenals/Urinary Tract: Small nodule with calcification in the right adrenal gland measuring 12 mm. Left adrenal gland and kidneys unremarkable. Urinary bladder unremarkable. No  hydronephrosis. Stomach/Bowel: Stomach, large and small bowel grossly unremarkable. Vascular/Lymphatic: Aortic atherosclerosis. No evidence of aneurysm or adenopathy. Reproductive: No visible focal abnormality. Other: No free fluid or free air. Musculoskeletal: No acute bony abnormality. IMPRESSION: No acute findings in the abdomen or pelvis. Small nonspecific nodule in the right adrenal gland. This could be followed with noncontrast CT abdomen in 6 months to assess stability or further characterized with MRI. Electronically Signed   By: Charlett Nose M.D.   On: 07/30/2021 22:04   DG Chest Port 1 View  Result Date: 07/30/2021 CLINICAL DATA:  Questionable sepsis EXAM: PORTABLE CHEST 1 VIEW COMPARISON:  09/27/2018 FINDINGS: Heart and mediastinal contours are within normal limits. No focal opacities or effusions. No acute bony abnormality. IMPRESSION: No active disease. Electronically Signed   By: Charlett Nose M.D.   On: 07/30/2021 17:33   US Abdomen Limited RUQ (LIVER/GB)  Result Date: 07/30/2021 CLINICAL DATA:  Elevated LFTs EXAM: ULTRASOUND ABDOMEN LIMITED RIGHT UPPER QUADRANT COMPARISON:  04/04/2017 abdomen ultrasound FINDINGS: Gallbladder: Gallstones, which measure up to 1.4 cm in size, similar to the prior exam. No wall thickening or pericholecystic fluid. No sonographic Murphy sign noted by  sonographer. Common bile duct: Diameter: 4 mm Liver: No focal lesion identified. Within normal limits in parenchymal echogenicity. Portal vein is patent on color Doppler imaging with normal direction of blood flow towards the liver. Other: None. IMPRESSION: Cholelithiasis without evidence cholecystitis. Otherwise unremarkable right upper quadrant ultrasound. Electronically Signed   By: Wiliam Ke M.D.   On: 07/30/2021 23:16    Scheduled Meds:  colchicine  0.6 mg Oral Daily   doxycycline  100 mg Oral Q12H   enoxaparin (LOVENOX) injection  40 mg Subcutaneous Q24H   folic acid  1 mg Oral Daily   Gerhardt's butt cream   Topical QID   multivitamin with minerals  1 tablet Oral Daily   pantoprazole  40 mg Oral Daily   thiamine  100 mg Oral Daily   Or   thiamine  100 mg Intravenous Daily    Continuous Infusions:  cefTRIAXone (ROCEPHIN)  IV     lactated ringers 125 mL/hr at 07/31/21 0751     LOS: 0 days     Darlin Drop, MD [facility] Pager [phone]  If 7PM-7AM, please contact night-coverage [email or web address] Password [email or web address] 07/31/2021, 1:10 PM

## 2021-08-01 LAB — COMPREHENSIVE METABOLIC PANEL
ALT: 58 U/L — ABNORMAL HIGH (ref 0–44)
AST: 41 U/L (ref 15–41)
Albumin: 2.2 g/dL — ABNORMAL LOW (ref 3.5–5.0)
Alkaline Phosphatase: 250 U/L — ABNORMAL HIGH (ref 38–126)
Anion gap: 7 (ref 5–15)
BUN: 15 mg/dL (ref 6–20)
CO2: 21 mmol/L — ABNORMAL LOW (ref 22–32)
Calcium: 8.5 mg/dL — ABNORMAL LOW (ref 8.9–10.3)
Chloride: 102 mmol/L (ref 98–111)
Creatinine, Ser: 0.77 mg/dL (ref 0.61–1.24)
GFR, Estimated: >60 mL/min (ref >60)
Glucose, Bld: 101 mg/dL — ABNORMAL HIGH (ref 70–99)
Potassium: 3.5 mmol/L (ref 3.5–5.1)
Sodium: 130 mmol/L — ABNORMAL LOW (ref 135–145)
Total Bilirubin: 1.3 mg/dL — ABNORMAL HIGH (ref 0.3–1.2)
Total Protein: 5.9 g/dL — ABNORMAL LOW (ref 6.5–8.1)

## 2021-08-01 LAB — CBC
HCT: 35.8 % — ABNORMAL LOW (ref 39.0–52.0)
Hemoglobin: 11.5 g/dL — ABNORMAL LOW (ref 13.0–17.0)
MCH: 30.3 pg (ref 26.0–34.0)
MCHC: 32.1 g/dL (ref 30.0–36.0)
MCV: 94.5 fL (ref 80.0–100.0)
Platelets: 320 10*3/uL (ref 150–400)
RBC: 3.79 MIL/uL — ABNORMAL LOW (ref 4.22–5.81)
RDW: 12.9 % (ref 11.5–15.5)
WBC: 6.7 10*3/uL (ref 4.0–10.5)
nRBC: 0 % (ref 0.0–0.2)

## 2021-08-01 LAB — MAGNESIUM: Magnesium: 2.4 mg/dL (ref 1.7–2.4)

## 2021-08-01 LAB — PHOSPHORUS: Phosphorus: 3.7 mg/dL (ref 2.5–4.6)

## 2021-08-01 MED ORDER — LACTATED RINGERS IV SOLN: INTRAVENOUS | Status: AC

## 2021-08-01 MED ORDER — GABAPENTIN 100 MG PO CAPS
100.0000 mg | ORAL_CAPSULE | Freq: Three times a day (TID) | ORAL | Status: DC
  Administered 2021-08-01 – 2021-08-03 (×6): 100 mg via ORAL
  Filled 2021-08-01 (×6): qty 1

## 2021-08-01 NOTE — Progress Notes (Addendum)
[employer], [employer] [facility]) Hospital Liaison: RN note    This patient was a new referral to [facility] home hospice services but not admitted to service. When our admission nurse visited to admit him she found him in unsafe conditions and without adequate caregivers. She notified [facility] due to conditions she noted in the home and called EMS.  [facility] liaisons will continue to follow for disposition.  A Please do not hesitate to call with questions.   Thank you,   Elsie Saas, RN, [facility]      [facility] Liaison   [phone]

## 2021-08-01 NOTE — Progress Notes (Signed)
PROGRESS NOTE  William [name] PXT:[id number] DOB: Oct 15, 1965 DOA: 07/30/2021 PCP: Patient, No Pcp Per (Inactive)  HPI/Recap of past 24 hours: William [name] is a 55 y.o. male with medical history significant of EtOH abuse, gout.  Recently seen in ED 6 days ago with pain in his wrists and legs bilaterally, diagnosed with gout flare, started on colchicine.  He was seen by visiting nurse at home prior to his presentation and was found to be generally weak.  Had not been off the couch for a while, urinating and stooling self.  Reported decubitus ulcer on backside.  Complaining of generalized pain involving both legs both wrists.  Symptoms are constant, severe, and worsening.  Reviewed imagings, including CT scan, could not completely rule out community-acquired pneumonia.  Procalcitonin elevated 0.72, treated empirically with Rocephin and p.o. doxycycline.  08/01/2021: Seen and examined at bedside, cough is better.  Mainly complaints of pain in his lower extremities joints bilaterally.      Assessment/Plan: Principal Problem:   SIRS (systemic inflammatory response syndrome) (HCC) Active Problems:   History of gout   Alcoholism (HCC)   Elevated LFTs   Generalized weakness   Pressure injury of skin  SIRS , could not rule out CAP, concern for developing CAP, poa- IVF: 1L bolus then LR at 125, reduce rate to 50 cc/hr x 2 days, encourage po intake. Could not rule out CAP Procalcitonin elevated greater than 0.7. Obtain MRSA screening test. Continue Rocephin and p.o. doxycycline. Lactate nl, BP nl BCx negative to date Covid and flu neg Personally reviewed chest x-ray and CT scanning could not rule out developing CAP. Gout, with acute flare - Cont colchicine Check uric acid WNL Start gabapentin EtOH abuse -   Continue CIWA protocol Continue MVM, Thiamine and folic acid supplements EtOH level negative UDS pending LFT elevation - RUQ [place] and CT AP = no acute findings, gallstones seen on  RUQ [place] Repeat CMP in AM Will check hepatitis acute panel Pressure injury - wound care consulted, continue local wound care Dental caries, chronic-Will need to follow up with a dentist.   DVT prophylaxis: Lovenox SQ daily Code Status: Full Family Communication: No family at bedside. Disposition Plan: Home after admit Consults called: None Admission status: Inpatient status.  Patient will require at least 2 midnights for further evaluation and treatment of present condition.    Procedures: None   Antimicrobials: Rocephin 07/31/21 Po Doxycycline 07/31/21    Objective: Vitals:   07/31/21 2306 08/01/21 0400 08/01/21 0810 08/01/21 1201  BP:  117/76 119/80 106/81  Pulse: 83 82 86 90  Resp:  19 17 20   Temp: (!) 97.2 F (36.2 C) 97.6 F (36.4 C) 98.9 F (37.2 C) 98.5 F (36.9 C)  TempSrc: Oral Oral Oral Oral  SpO2:  98% 95% 96%  Weight:      Height:        Intake/Output Summary (Last 24 hours) at 08/01/2021 1253 Last data filed at 08/01/2021 1202 Gross per 24 hour  Intake 120 ml  Output 575 ml  Net -455 ml   Filed Weights   07/30/21 1639 07/31/21 2132  Weight: 81.6 kg 81.5 kg    Exam:  General: 55 y.o. year-old male well-developed well-nourished in no acute distress.  He is alert and oriented x3.   Cardiovascular: Regular rate and rhythm no rubs or gallops.  No JVD or thyromegaly noted.   Respiratory: Fine rales at bases but no wheezing noted.  Good inspiratory effort. Abdomen: Soft  nontender nondistended normal bowel sounds present.   Musculoskeletal: Trace lower extremity edema bilaterally.   Skin: No ulcerative lesions noted. Psychiatry: Mood is appropriate for condition and setting.   Data Reviewed: CBC: Recent Labs  Lab 07/30/21 1711 07/30/21 1937 07/31/21 0010 08/01/21 0215  WBC 12.4*  --  9.6 6.7  NEUTROABS 9.7*  --   --   --   HGB 14.4 12.6* 12.9* 11.5*  HCT 42.9 37.0* 41.5 35.8*  MCV 93.3  --  99.5 94.5  PLT 449*  --  361 320   Basic  Metabolic Panel: Recent Labs  Lab 07/30/21 1711 07/30/21 1937 07/31/21 0010 08/01/21 0215  NA 136 138 136 130*  K 4.2 4.0 4.1 3.5  CL 103 106 106 102  CO2 19*  --  21* 21*  GLUCOSE 108* 118* 109* 101*  BUN 25* 24* 23* 15  CREATININE 0.99 0.80 0.97 0.77  CALCIUM 9.6  --  9.2 8.5*  MG 2.5*  --   --  2.4  PHOS  --   --   --  3.7   GFR: Estimated Creatinine Clearance: 111.1 mL/min (by C-G formula based on SCr of 0.77 mg/dL). Liver Function Tests: Recent Labs  Lab 07/30/21 1711 07/31/21 0010 08/01/21 0215  AST 102* 73* 41  ALT 100* 84* 58*  ALKPHOS 353* 296* 250*  BILITOT 2.4* 2.0* 1.3*  PROT 7.6 6.7 5.9*  ALBUMIN 2.7* 2.4* 2.2*   Recent Labs  Lab 07/30/21 1711  LIPASE 30   Recent Labs  Lab 07/30/21 2348  AMMONIA 42*   Coagulation Profile: Recent Labs  Lab 07/30/21 1711  INR 1.1   Cardiac Enzymes: Recent Labs  Lab 07/30/21 1711  CKTOTAL 157   BNP (last 3 results) No results for input(s): PROBNP in the last 8760 hours. HbA1C: No results for input(s): HGBA1C in the last 72 hours. CBG: No results for input(s): GLUCAP in the last 168 hours. Lipid Profile: No results for input(s): CHOL, HDL, LDLCALC, TRIG, CHOLHDL, LDLDIRECT in the last 72 hours. Thyroid Function Tests: No results for input(s): TSH, T4TOTAL, FREET4, T3FREE, THYROIDAB in the last 72 hours. Anemia Panel: No results for input(s): VITAMINB12, FOLATE, FERRITIN, TIBC, IRON, RETICCTPCT in the last 72 hours. Urine analysis:    Component Value Date/Time   COLORURINE AMBER (A) 07/30/2021 1653   APPEARANCEUR CLEAR 07/30/2021 1653   LABSPEC 1.021 07/30/2021 1653   PHURINE 7.0 07/30/2021 1653   GLUCOSEU NEGATIVE 07/30/2021 1653   HGBUR NEGATIVE 07/30/2021 1653   BILIRUBINUR NEGATIVE 07/30/2021 1653   KETONESUR NEGATIVE 07/30/2021 1653   PROTEINUR NEGATIVE 07/30/2021 1653   UROBILINOGEN 0.2 01/26/2015 0903   NITRITE NEGATIVE 07/30/2021 1653   LEUKOCYTESUR NEGATIVE 07/30/2021 1653   Sepsis  Labs: @LABRCNTIP (procalcitonin:4,lacticidven:4)  ) Recent Results (from the past 240 hour(s))  Blood Culture (routine x 2)     Status: None (Preliminary result)   Collection Time: 07/30/21  5:11 PM   Specimen: BLOOD LEFT ARM  Result Value Ref Range Status   Specimen Description BLOOD LEFT ARM  Final   Special Requests   Final    BOTTLES DRAWN AEROBIC AND ANAEROBIC Blood Culture results may not be optimal due to an excessive volume of blood received in culture bottles   Culture   Final    NO GROWTH 2 DAYS Performed at [facility], [address], [place], [place]    Report Status PENDING  Incomplete  Urine Culture     Status: Abnormal  Collection Time: 07/30/21  5:50 PM   Specimen: In/Out Cath Urine  Result Value Ref Range Status   Specimen Description IN/OUT CATH URINE  Final   Special Requests   Final    NONE Performed at [facility], [address] [place], [place] [address]    Culture MULTIPLE SPECIES PRESENT, SUGGEST RECOLLECTION (A)  Final   Report Status 07/31/2021 FINAL  Final  Resp Panel by RT-PCR (Flu A&B, Covid) In/Out Cath Urine     Status: None   Collection Time: 07/30/21  5:50 PM   Specimen: In/Out Cath Urine; Nasopharyngeal(NP) swabs in vial transport medium  Result Value Ref Range Status   SARS Coronavirus 2 by RT PCR NEGATIVE NEGATIVE Final    Comment: (NOTE) SARS-CoV-2 target nucleic acids are NOT DETECTED.  The SARS-CoV-2 RNA is generally detectable in upper respiratory specimens during the acute phase of infection. The lowest concentration of SARS-CoV-2 viral copies this assay can detect is 138 copies/mL. A negative result does not preclude SARS-Cov-2 infection and should not be used as the sole basis for treatment or other patient management decisions. A negative result may occur with  improper specimen collection/handling, submission of specimen other than nasopharyngeal swab, presence of viral mutation(s) within the areas  targeted by this assay, and inadequate number of viral copies(<138 copies/mL). A negative result must be combined with clinical observations, patient history, and epidemiological information. The expected result is Negative.  Fact Sheet for Patients:  BloggerCourse.com  Fact Sheet for Healthcare Providers:  SeriousBroker.it  This test is no t yet approved or cleared by the [place] FDA and  has been authorized for detection and/or diagnosis of SARS-CoV-2 by FDA under an Emergency Use Authorization (EUA). This EUA will remain  in effect (meaning this test can be used) for the duration of the COVID-19 declaration under Section 564(b)(1) of the Act, 21 U.S.C.section 360bbb-3(b)(1), unless the authorization is terminated  or revoked sooner.       Influenza A by PCR NEGATIVE NEGATIVE Final   Influenza B by PCR NEGATIVE NEGATIVE Final    Comment: (NOTE) The Xpert Xpress SARS-CoV-2/FLU/RSV plus assay is intended as an aid in the diagnosis of influenza from Nasopharyngeal swab specimens and should not be used as a sole basis for treatment. Nasal washings and aspirates are unacceptable for Xpert Xpress SARS-CoV-2/FLU/RSV testing.  Fact Sheet for Patients: BloggerCourse.com  Fact Sheet for Healthcare Providers: SeriousBroker.it  This test is not yet approved or cleared by the [place] FDA and has been authorized for detection and/or diagnosis of SARS-CoV-2 by FDA under an Emergency Use Authorization (EUA). This EUA will remain in effect (meaning this test can be used) for the duration of the COVID-19 declaration under Section 564(b)(1) of the Act, 21 U.S.C. section 360bbb-3(b)(1), unless the authorization is terminated or revoked.  Performed at [facility], [address] [place], [place] [address]   Blood Culture (routine x 2)     Status: None (Preliminary result)    Collection Time: 07/30/21 10:31 PM   Specimen: BLOOD  Result Value Ref Range Status   Specimen Description BLOOD BLOOD RIGHT HAND  Final   Special Requests   Final    BOTTLES DRAWN AEROBIC AND ANAEROBIC Blood Culture adequate volume   Culture   Final    NO GROWTH 2 DAYS Performed at [facility], [address] [place], [place] [address]    Report Status PENDING  Incomplete      Studies:  No results found.  Scheduled Meds:  colchicine  0.6 mg Oral Daily   doxycycline  100 mg Oral Q12H   enoxaparin (LOVENOX) injection  40 mg Subcutaneous Q24H   folic acid  1 mg Oral Daily   gabapentin  100 mg Oral TID   Gerhardt's butt cream   Topical QID   multivitamin with minerals  1 tablet Oral Daily   pantoprazole  40 mg Oral Daily   thiamine  100 mg Oral Daily   Or   thiamine  100 mg Intravenous Daily    Continuous Infusions:  cefTRIAXone (ROCEPHIN)  IV Stopped (07/31/21 1452)   lactated ringers 125 mL/hr at 07/31/21 1716     LOS: 1 day     Darlin Drop, MD [facility] Pager [phone]  If 7PM-7AM, please contact night-coverage [email or web address] Password [email or web address] 08/01/2021, 12:53 PM

## 2021-08-01 NOTE — Progress Notes (Signed)
CSW acknowledges SNF consult, however patient is uninsured. CSW will follow for further progress as pain is currently limiting mobility.   Joaquin Courts, MSW, [facility]

## 2021-08-02 LAB — MRSA NEXT GEN BY PCR, NASAL: MRSA by PCR Next Gen: NOT DETECTED

## 2021-08-02 MED ORDER — IPRATROPIUM-ALBUTEROL 0.5-2.5 (3) MG/3ML IN SOLN
3.0000 mL | Freq: Three times a day (TID) | RESPIRATORY_TRACT | Status: DC
  Administered 2021-08-02: 3 mL via RESPIRATORY_TRACT
  Filled 2021-08-02: qty 3

## 2021-08-02 MED ORDER — IPRATROPIUM-ALBUTEROL 0.5-2.5 (3) MG/3ML IN SOLN: 3.0000 mL | Freq: Four times a day (QID) | RESPIRATORY_TRACT | Status: DC | PRN

## 2021-08-02 MED ORDER — ENSURE ENLIVE PO LIQD
237.0000 mL | Freq: Three times a day (TID) | ORAL | Status: DC
  Administered 2021-08-02 – 2021-08-13 (×24): 237 mL via ORAL
  Filled 2021-08-02: qty 237

## 2021-08-02 NOTE — Progress Notes (Signed)
Initial Nutrition Assessment  DOCUMENTATION CODES:   Severe malnutrition in context of chronic illness  INTERVENTION:   -Ensure Enlive po TID, each supplement provides 350 kcal and 20 grams of protein  -Magic cup TID with meals, each supplement provides 290 kcal and 9 grams of protein  -MVI with minerals daily -Downgrade diet to dysphagia 3 (advanced mechanical soft) for ease of intake (pt with multiple missing teeth  NUTRITION DIAGNOSIS:   Severe Malnutrition related to chronic illness (ETOH abuse, gout) as evidenced by severe fat depletion, severe muscle depletion.  GOAL:   Patient will meet greater than or equal to 90% of their needs  MONITOR:   PO intake, Supplement acceptance, Labs, Weight trends, Skin, I & O's  REASON FOR ASSESSMENT:   Consult Assessment of nutrition requirement/status, Poor PO  ASSESSMENT:   William [name] is a 55 y.o. male with medical history significant of EtOH abuse, gout.  Recently seen in ED 6 days ago with pain in his wrists and legs bilaterally, diagnosed with gout flare, started on colchicine.  He was seen by visiting nurse at home prior to his presentation and was found to be generally weak.  Had not been off the couch for a while, urinating and stooling self.  Reported decubitus ulcer on backside.  Complaining of generalized pain involving both legs both wrists.  Symptoms are constant, severe, and worsening.  Reviewed imagings, including CT scan, could not completely rule out community-acquired pneumonia.  Procalcitonin elevated 0.72, treated empirically with Rocephin and p.o. doxycycline.  Pt admitted with SIRS.   Reviewed I/O's: -761 ml x 24 hours and -336 ml since admission  UOP: 1.7 L x 24 hours  Spoke with pt at bedside, who reports feeling poorly today. He reports he ate breakfast, but it "ran right through him". He also endorses urinating more frequently than usual. He complains of multiple missing teeth, as well as visual  impairment.  Pt describes a general decline in health over the past 3 years, when he was last hospital and diagnosed with gout. He endorses wt loss, but unsure how much or his UBW. He feels much more weak. Pt unable to provide diet recall, stating he eats "when I feel like it". He shares that two men who live with him do all of the cooking. Noted meal completions 100%.   Discussed importance of good meal and supplement intake to promote healing. Pt amenable to diet downgrade and Ensure Enlive supplements.   Medications reviewed and include folic acid and thiamine.   Labs reviewed: Na: 130.   NUTRITION - FOCUSED PHYSICAL EXAM:  Flowsheet Row Most Recent Value  Orbital Region Severe depletion  Upper Arm Region Moderate depletion  Thoracic and Lumbar Region Moderate depletion  Buccal Region Severe depletion  Temple Region Severe depletion  Clavicle Bone Region Severe depletion  Clavicle and Acromion Bone Region Severe depletion  Scapular Bone Region Severe depletion  Dorsal Hand Moderate depletion  Patellar Region Severe depletion  Anterior Thigh Region Severe depletion  Posterior Calf Region Severe depletion  Edema (RD Assessment) None  Hair Reviewed  Eyes Reviewed  Mouth Reviewed  Skin Reviewed  Nails Reviewed       Diet Order:   Diet Order             Diet Heart Room service appropriate? Yes with Assist; Fluid consistency: Thin  Diet effective now                   EDUCATION NEEDS:  Education needs have been addressed  Skin:  Skin Assessment: Skin Integrity Issues: Skin Integrity Issues:: Stage II Stage II: buttocks  Last BM:  08/01/21  Height:   Ht Readings from Last 1 Encounters:  07/31/21 5\' 11"  (1.803 m)    Weight:   Wt Readings from Last 1 Encounters:  07/31/21 81.5 kg    Ideal Body Weight:  78.2 kg  BMI:  Body mass index is 25.06 kg/m.  Estimated Nutritional Needs:   Kcal:  08/02/21  Protein:  140-155 grams  Fluid:  > 2  L    [id number], RD, LDN, CDCES Registered Dietitian II Certified Diabetes Care and Education Specialist Please refer to [facility] for RD and/or RD on-call/weekend/after hours pager

## 2021-08-02 NOTE — Progress Notes (Signed)
PROGRESS NOTE  William [name] QMG:[id number] DOB: [birth year] DOA: 07/30/2021 PCP: Patient, No Pcp Per (Inactive)  HPI/Recap of past 24 hours: William [name] is a 55 y.o. male with medical history significant of EtOH abuse, gout.  Recently seen in ED 6 days ago with pain in his wrists and legs bilaterally, diagnosed with gout flare, started on colchicine.  He was seen by visiting nurse at home prior to his presentation and was found to be generally weak.  Had not been off the couch for a while, urinating and stooling self.  Reported decubitus ulcer on backside.  Complaining of generalized pain involving both legs both wrists.  Symptoms are constant, severe, and worsening.  Reviewed imagings, including CT scan, could not completely rule out community-acquired pneumonia.  Procalcitonin elevated 0.72, treated empirically with Rocephin and p.o. doxycycline.  08/02/2021: Seen and examined at the bedside.  He states he feels better today.  His lower extremities pain is also improved.    He gives his next of kin's number, his nephew Onalee Hua who can be reached at [phone], declines to discharge to his home due to, he stated rats in his home.    Assessment/Plan: Principal Problem:   SIRS (systemic inflammatory response syndrome) (HCC) Active Problems:   History of gout   Alcoholism (HCC)   Elevated LFTs   Generalized weakness   Pressure injury of skin  SIRS , could not rule out CAP, concern for developing CAP, poa- Continue gentle IV fluid hydration, Rocephin and p.o. doxycycline x5 days DuoNebs every 6 hours. Mobilize as tolerated Incentive spirometer. Gout, with acute flare, improving- Cont colchicine Check uric acid WNL Pain also improved on gabapentin, continue EtOH abuse -   Continue CIWA protocol Continue MVM, Thiamine and folic acid supplements EtOH level negative UDS pending No evidence of acute alcohol withdrawal at time of visit. LFT elevation - RUQ [place] and CT AP = no acute  findings, gallstones seen on RUQ [place] Repeat CMP in AM Acute hepatitis panel negative. Pressure injury - wound care consulted, continue local wound care, with wound care specialist instructions.   Dental caries, chronic-Will need to follow up with a dentist.   DVT prophylaxis: Lovenox SQ daily Code Status: Full Family Communication: No family at bedside. Disposition Plan: Likely to home on 08/03/2021. Consults called: None Admission status: Inpatient status.  Patient will require at least 2 midnights for further evaluation and treatment of present condition.    Procedures: None   Antimicrobials: Rocephin 07/31/21 Po Doxycycline 07/31/21    Objective: Vitals:   08/02/21 0000 08/02/21 0400 08/02/21 0744 08/02/21 0745  BP: 120/75 107/79 106/74 111/73  Pulse: 78 77 84 81  Resp: 16 14 14  (!) 21  Temp: 98.8 F (37.1 C) 99.4 F (37.4 C) 98.2 F (36.8 C) 98.6 F (37 C)  TempSrc: Oral Axillary Oral Axillary  SpO2: 96% 97% 96% 94%  Weight:      Height:        Intake/Output Summary (Last 24 hours) at 08/02/2021 1521 Last data filed at 08/02/2021 1100 Gross per 24 hour  Intake 1069.38 ml  Output 1150 ml  Net -80.62 ml   Filed Weights   07/30/21 1639 07/31/21 2132  Weight: 81.6 kg 81.5 kg    Exam:  General: 55 y.o. year-old male well-developed well-nourished in no acute stress.  He is alert and oriented x3.   Cardiovascular: Regular rate and rhythm no rubs or gallops. Respiratory: Mild rales at bases no wheezing noted.  Good  inspiratory effort. Abdomen: Soft nontender normal bowel sounds present. \ Musculoskeletal: Trace lower extremity edema bilaterally. Skin: No ulcerative lesions noted. Psychiatry: Mood is appropriate for condition and setting.   Data Reviewed: CBC: Recent Labs  Lab 07/30/21 1711 07/30/21 1937 07/31/21 0010 08/01/21 0215  WBC 12.4*  --  9.6 6.7  NEUTROABS 9.7*  --   --   --   HGB 14.4 12.6* 12.9* 11.5*  HCT 42.9 37.0* 41.5 35.8*  MCV  93.3  --  99.5 94.5  PLT 449*  --  361 320   Basic Metabolic Panel: Recent Labs  Lab 07/30/21 1711 07/30/21 1937 07/31/21 0010 08/01/21 0215  NA 136 138 136 130*  K 4.2 4.0 4.1 3.5  CL 103 106 106 102  CO2 19*  --  21* 21*  GLUCOSE 108* 118* 109* 101*  BUN 25* 24* 23* 15  CREATININE 0.99 0.80 0.97 0.77  CALCIUM 9.6  --  9.2 8.5*  MG 2.5*  --   --  2.4  PHOS  --   --   --  3.7   GFR: Estimated Creatinine Clearance: 111.1 mL/min (by C-G formula based on SCr of 0.77 mg/dL). Liver Function Tests: Recent Labs  Lab 07/30/21 1711 07/31/21 0010 08/01/21 0215  AST 102* 73* 41  ALT 100* 84* 58*  ALKPHOS 353* 296* 250*  BILITOT 2.4* 2.0* 1.3*  PROT 7.6 6.7 5.9*  ALBUMIN 2.7* 2.4* 2.2*   Recent Labs  Lab 07/30/21 1711  LIPASE 30   Recent Labs  Lab 07/30/21 2348  AMMONIA 42*   Coagulation Profile: Recent Labs  Lab 07/30/21 1711  INR 1.1   Cardiac Enzymes: Recent Labs  Lab 07/30/21 1711  CKTOTAL 157   BNP (last 3 results) No results for input(s): PROBNP in the last 8760 hours. HbA1C: No results for input(s): HGBA1C in the last 72 hours. CBG: No results for input(s): GLUCAP in the last 168 hours. Lipid Profile: No results for input(s): CHOL, HDL, LDLCALC, TRIG, CHOLHDL, LDLDIRECT in the last 72 hours. Thyroid Function Tests: No results for input(s): TSH, T4TOTAL, FREET4, T3FREE, THYROIDAB in the last 72 hours. Anemia Panel: No results for input(s): VITAMINB12, FOLATE, FERRITIN, TIBC, IRON, RETICCTPCT in the last 72 hours. Urine analysis:    Component Value Date/Time   COLORURINE AMBER (A) 07/30/2021 1653   APPEARANCEUR CLEAR 07/30/2021 1653   LABSPEC 1.021 07/30/2021 1653   PHURINE 7.0 07/30/2021 1653   GLUCOSEU NEGATIVE 07/30/2021 1653   HGBUR NEGATIVE 07/30/2021 1653   BILIRUBINUR NEGATIVE 07/30/2021 1653   KETONESUR NEGATIVE 07/30/2021 1653   PROTEINUR NEGATIVE 07/30/2021 1653   UROBILINOGEN 0.2 01/26/2015 0903   NITRITE NEGATIVE 07/30/2021 1653    LEUKOCYTESUR NEGATIVE 07/30/2021 1653   Sepsis Labs: @LABRCNTIP (procalcitonin:4,lacticidven:4)  ) Recent Results (from the past 240 hour(s))  Blood Culture (routine x 2)     Status: None (Preliminary result)   Collection Time: 07/30/21  5:11 PM   Specimen: BLOOD LEFT ARM  Result Value Ref Range Status   Specimen Description BLOOD LEFT ARM  Final   Special Requests   Final    BOTTLES DRAWN AEROBIC AND ANAEROBIC Blood Culture results may not be optimal due to an excessive volume of blood received in culture bottles   Culture   Final    NO GROWTH 3 DAYS Performed at [facility], [address], [place], [place]    Report Status PENDING  Incomplete  Urine Culture     Status: Abnormal  Collection Time: 07/30/21  5:50 PM   Specimen: In/Out Cath Urine  Result Value Ref Range Status   Specimen Description IN/OUT CATH URINE  Final   Special Requests   Final    NONE Performed at [facility], [address] [place], [place] [address]    Culture MULTIPLE SPECIES PRESENT, SUGGEST RECOLLECTION (A)  Final   Report Status 07/31/2021 FINAL  Final  Resp Panel by RT-PCR (Flu A&B, Covid) In/Out Cath Urine     Status: None   Collection Time: 07/30/21  5:50 PM   Specimen: In/Out Cath Urine; Nasopharyngeal(NP) swabs in vial transport medium  Result Value Ref Range Status   SARS Coronavirus 2 by RT PCR NEGATIVE NEGATIVE Final    Comment: (NOTE) SARS-CoV-2 target nucleic acids are NOT DETECTED.  The SARS-CoV-2 RNA is generally detectable in upper respiratory specimens during the acute phase of infection. The lowest concentration of SARS-CoV-2 viral copies this assay can detect is 138 copies/mL. A negative result does not preclude SARS-Cov-2 infection and should not be used as the sole basis for treatment or other patient management decisions. A negative result may occur with  improper specimen collection/handling, submission of specimen other than nasopharyngeal swab,  presence of viral mutation(s) within the areas targeted by this assay, and inadequate number of viral copies(<138 copies/mL). A negative result must be combined with clinical observations, patient history, and epidemiological information. The expected result is Negative.  Fact Sheet for Patients:  BloggerCourse.com  Fact Sheet for Healthcare Providers:  SeriousBroker.it  This test is no t yet approved or cleared by the [place] FDA and  has been authorized for detection and/or diagnosis of SARS-CoV-2 by FDA under an Emergency Use Authorization (EUA). This EUA will remain  in effect (meaning this test can be used) for the duration of the COVID-19 declaration under Section 564(b)(1) of the Act, 21 U.S.C.section 360bbb-3(b)(1), unless the authorization is terminated  or revoked sooner.       Influenza A by PCR NEGATIVE NEGATIVE Final   Influenza B by PCR NEGATIVE NEGATIVE Final    Comment: (NOTE) The Xpert Xpress SARS-CoV-2/FLU/RSV plus assay is intended as an aid in the diagnosis of influenza from Nasopharyngeal swab specimens and should not be used as a sole basis for treatment. Nasal washings and aspirates are unacceptable for Xpert Xpress SARS-CoV-2/FLU/RSV testing.  Fact Sheet for Patients: BloggerCourse.com  Fact Sheet for Healthcare Providers: SeriousBroker.it  This test is not yet approved or cleared by the [place] FDA and has been authorized for detection and/or diagnosis of SARS-CoV-2 by FDA under an Emergency Use Authorization (EUA). This EUA will remain in effect (meaning this test can be used) for the duration of the COVID-19 declaration under Section 564(b)(1) of the Act, 21 U.S.C. section 360bbb-3(b)(1), unless the authorization is terminated or revoked.  Performed at [facility], [address], [place], [place] [address]   Blood Culture  (routine x 2)     Status: None (Preliminary result)   Collection Time: 07/30/21 10:31 PM   Specimen: BLOOD  Result Value Ref Range Status   Specimen Description BLOOD BLOOD RIGHT HAND  Final   Special Requests   Final    BOTTLES DRAWN AEROBIC AND ANAEROBIC Blood Culture adequate volume   Culture   Final    NO GROWTH 3 DAYS Performed at [facility], [address] [place], [place] [address]    Report Status PENDING  Incomplete      Studies:  No results found.  Scheduled Meds:  colchicine  0.6 mg Oral Daily   doxycycline  100 mg Oral Q12H   enoxaparin (LOVENOX) injection  40 mg Subcutaneous Q24H   feeding supplement  237 mL Oral TID BM   folic acid  1 mg Oral Daily   gabapentin  100 mg Oral TID   Gerhardt's butt cream   Topical QID   multivitamin with minerals  1 tablet Oral Daily   pantoprazole  40 mg Oral Daily   thiamine  100 mg Oral Daily   Or   thiamine  100 mg Intravenous Daily    Continuous Infusions:  cefTRIAXone (ROCEPHIN)  IV 1 g (08/02/21 1323)   lactated ringers 50 mL/hr at 08/02/21 1042     LOS: 2 days     Darlin Drop, MD [facility] Pager [phone]  If 7PM-7AM, please contact night-coverage [email or web address] Password [email or web address] 08/02/2021, 3:21 PM

## 2021-08-02 NOTE — Progress Notes (Addendum)
Physical Therapy Treatment Patient Details Name: William [name] MRN: [id number] DOB: 30-Sep-1966 Today's Date: 08/02/2021   History of Present Illness 55 yo male presenting generalized weakness and bilateral wrist and leg pain. Diagnosed with gout flare; started on colchicine. PMH including EtOH abuse, cholecystitis, liver failure (2018), and gout.    PT Comments    Patient progressing with activity tolerance able to stand to step to chair with RW and A.  He has also improved with mentation following commands well and able to accurately state why he has a pressure wound.  Still limited by pain in LE's and on sacrum not tolerating up in chair long.  He will benefit from continued skilled PT in the acute setting and SNF level rehab at d/c unless can have care person to provide assist when up on his feet.  He is progressing and with further treatment of his gout may allow ambulation at next session.  Will continue to follow.     Recommendations for follow up therapy are one component of a multi-disciplinary discharge planning process, led by the attending physician.  Recommendations may be updated based on patient status, additional functional criteria and insurance authorization.  Follow Up Recommendations  SNF;Supervision for mobility/OOB     Equipment Recommendations  Rolling walker with 5" wheels;Wheelchair (measurements PT);3in1 (PT);Wheelchair cushion (measurements PT)    Recommendations for Other Services       Precautions / Restrictions Precautions Precautions: Fall Precaution Comments: painful feet/ankles and wrists- recent gout flare Restrictions Weight Bearing Restrictions: No     Mobility  Bed Mobility Overal bed mobility: Needs Assistance Bed Mobility: Supine to Sit;Sit to Supine     Supine to sit: HOB elevated;Mod assist Sit to supine: Min assist   General bed mobility comments: up to EOB with HOB up and pt needing lifting help for trunk cues for LE's; to supine  with assist for positioning, cues for technique; rolled to R for pillow under hip to offload sacrum in supine    Transfers Overall transfer level: Needs assistance Equipment used: Rolling walker (2 wheeled) Transfers: Sit to/from [employer] Sit to Stand: Mod assist;+2 safety/equipment;From elevated surface;+2 physical assistance Stand pivot transfers: Mod assist;+2 safety/equipment       General transfer comment: from EOB higher surface pt able to stand with cues for hand/foot placement and A for lifting and steadying with RW; from recliner +2 A for lifting and stabilizing, able to step to chair then to bed with RW and cues for safety with walker management and assist for balance pt c/o pain in LE's when up weight bearing  Ambulation/Gait             General Gait Details: Not able to tolerate much time on his feet   Stairs             Wheelchair Mobility    Modified Rankin (Stroke Patients Only)       Balance Overall balance assessment: Needs assistance Sitting-balance support: No upper extremity supported Sitting balance-Leahy Scale: Fair Sitting balance - Comments: weak core, but can sit without UE support on EOB   Standing balance support: Bilateral upper extremity supported Standing balance-Leahy Scale: Poor Standing balance comment: UE support and physical assist for balance                            Cognition Arousal/Alertness: Awake/alert Behavior During Therapy: Flat affect Overall Cognitive Status: Impaired/Different from baseline Area of Impairment:  Orientation;Memory;Problem solving;Awareness;Following commands                 Orientation Level: Disoriented to;Time (oriented to day of the week and year, not month)   Memory: Decreased short-term memory Following Commands: Follows one step commands consistently;Follows multi-step commands with increased time   Awareness: Emergent Problem Solving: Slow  processing        Exercises      General Comments General comments (skin integrity, edema, etc.): Did not tolerate up in recliner well despite pillows under hips due to sacral wound,  Encourage for lunch, but pt preferred back in bed at this time.  Sat in chair long enough to participate in grooming tasks with OT.      Pertinent Vitals/Pain Faces Pain Scale: Hurts whole lot Pain Location: BLEs with weight bearing Pain Descriptors / Indicators: Aching;Guarding;Grimacing Pain Intervention(s): Monitored during session;Limited activity within patient's tolerance;Repositioned    Home Living                      Prior Function            PT Goals (current goals can now be found in the care plan section) Progress towards PT goals: Progressing toward goals    Frequency    Min 2X/week      PT Plan Current plan remains appropriate    Co-evaluation PT/OT/SLP Co-Evaluation/Treatment: Yes Reason for Co-Treatment: For patient/therapist safety;To address functional/ADL transfers PT goals addressed during session: Mobility/safety with mobility;Balance;Proper use of DME        AM-PAC PT "6 Clicks" Mobility   Outcome Measure  Help needed turning from your back to your side while in a flat bed without using bedrails?: A Lot Help needed moving from lying on your back to sitting on the side of a flat bed without using bedrails?: A Lot Help needed moving to and from a bed to a chair (including a wheelchair)?: A Lot Help needed standing up from a chair using your arms (e.g., wheelchair or bedside chair)?: Total Help needed to walk in hospital room?: Total Help needed climbing 3-5 steps with a railing? : Total 6 Click Score: 9    End of Session Equipment Utilized During Treatment: Gait belt Activity Tolerance: Patient limited by pain Patient left: in bed;with call bell/phone within reach;with bed alarm set   PT Visit Diagnosis: Other abnormalities of gait and mobility  (R26.89);Difficulty in walking, not elsewhere classified (R26.2);Muscle weakness (generalized) (M62.81) Pain - Right/Left:  (both) Pain - part of body: Hand;Ankle and joints of foot     Time: 3818-2993 PT Time Calculation (min) (ACUTE ONLY): 24 min  Charges:  $Therapeutic Activity: 8-22 mins                     Sheran Lawless, PT Acute Rehabilitation Services Pager:[phone] [phone] 08/02/2021    Elray Mcgregor 08/02/2021, 12:42 PM

## 2021-08-02 NOTE — Progress Notes (Signed)
Occupational Therapy Treatment Patient Details Name: William [name] MRN: [id number] DOB: [birth year] Today's Date: 08/02/2021   History of present illness 55 yo male presenting generalized weakness and bilateral wrist and leg pain. Diagnosed with gout flare; started on colchicine. PMH including EtOH abuse, cholecystitis, liver failure (2018), and gout.   OT comments  Patient received in bed and agreeable to participate with therapy.  Patient demonstrated improvement with bed mobility and transfers. Patient was able to transfer to recliner and perform grooming but stated he had sacral pain and that he would rather go back to bed to alloy him to relieve pressure.  Acute OT to continue to follow.    Recommendations for follow up therapy are one component of a multi-disciplinary discharge planning process, led by the attending physician.  Recommendations may be updated based on patient status, additional functional criteria and insurance authorization.    Follow Up Recommendations  SNF    Equipment Recommendations  Other (comment)    Recommendations for Other Services      Precautions / Restrictions Precautions Precautions: Fall Precaution Comments: painful feet/ankles and wrists- recent gout flare Restrictions Weight Bearing Restrictions: No       Mobility Bed Mobility Overal bed mobility: Needs Assistance Bed Mobility: Supine to Sit;Sit to Supine     Supine to sit: HOB elevated;Mod assist Sit to supine: Min assist   General bed mobility comments: up to EOB with HOB up and pt needing lifting help for trunk cues for LE's; to supine with assist for positioning, cues for technique; rolled to R for pillow under hip to offload sacrum in supine    Transfers Overall transfer level: Needs assistance Equipment used: Rolling walker (2 wheeled) Transfers: Sit to/from [employer] Sit to Stand: Mod assist;+2 safety/equipment;From elevated surface;+2 physical  assistance Stand pivot transfers: Mod assist;+2 safety/equipment       General transfer comment: from EOB higher surface pt able to stand with cues for hand/foot placement and A for lifting and steadying with RW; from recliner +2 A for lifting and stabilizing, able to step to chair then to bed with RW and cues for safety with walker management and assist for balance pt c/o pain in LE's when up weight bearing    Balance Overall balance assessment: Needs assistance Sitting-balance support: No upper extremity supported Sitting balance-Leahy Scale: Fair Sitting balance - Comments: weak core, but can sit without UE support on EOB   Standing balance support: Bilateral upper extremity supported Standing balance-Leahy Scale: Poor Standing balance comment: UE support and physical assist for balance                           ADL either performed or assessed with clinical judgement   ADL Overall ADL's : Needs assistance/impaired     Grooming: Wash/dry hands;Wash/dry face;Oral care;Sitting;Set up Grooming Details (indicate cue type and reason): performed seated in recliner                               General ADL Comments: performed transfer to recliner from eob and performed grooming. returned to bed due to pain in sacral area     Vision       Perception     Praxis      Cognition Arousal/Alertness: Awake/alert Behavior During Therapy: Flat affect Overall Cognitive Status: Impaired/Different from baseline Area of Impairment: Orientation;Memory;Problem solving;Awareness;Following commands  Orientation Level: Disoriented to;Time   Memory: Decreased short-term memory Following Commands: Follows one step commands consistently;Follows multi-step commands with increased time   Awareness: Emergent Problem Solving: Slow processing General Comments: able to follow commands        Exercises     Shoulder Instructions       General  Comments Did not tolerate up in recliner well despite pillows under hips due to sacral wound,  Encourage for lunch, but pt preferred back in bed at this time.  Sat in chair long enough to participate in grooming tasks with OT.    Pertinent Vitals/ Pain       Pain Assessment: Faces Faces Pain Scale: Hurts whole lot Pain Location: BLEs with weight bearing Pain Descriptors / Indicators: Aching;Guarding;Grimacing Pain Intervention(s): Limited activity within patient's tolerance;Monitored during session  Home Living                                          Prior Functioning/Environment              Frequency  Min 2X/week        Progress Toward Goals  OT Goals(current goals can now be found in the care plan section)  Progress towards OT goals: Progressing toward goals  Acute Rehab OT Goals Patient Stated Goal: Stop pain OT Goal Formulation: With patient Time For Goal Achievement: 08/14/21 Potential to Achieve Goals: Good ADL Goals Pt Will Perform Upper Body Dressing: with supervision;sitting Pt Will Transfer to Toilet: with min assist;stand pivot transfer;bedside commode Additional ADL Goal #1: Pt will perform bed mobility with Min A in preparation for ADLs  Plan Discharge plan remains appropriate    Co-evaluation      Reason for Co-Treatment: For patient/therapist safety;To address functional/ADL transfers PT goals addressed during session: Mobility/safety with mobility;Balance;Proper use of DME        AM-PAC OT "6 Clicks" Daily Activity     Outcome Measure   Help from another person eating meals?: A Little Help from another person taking care of personal grooming?: A Little Help from another person toileting, which includes using toliet, bedpan, or urinal?: A Lot Help from another person bathing (including washing, rinsing, drying)?: A Lot Help from another person to put on and taking off regular upper body clothing?: A Little Help from another  person to put on and taking off regular lower body clothing?: A Lot 6 Click Score: 15    End of Session Equipment Utilized During Treatment: Gait belt;Rolling walker  OT Visit Diagnosis: Unsteadiness on feet (R26.81);Other abnormalities of gait and mobility (R26.89);Muscle weakness (generalized) (M62.81);Pain Pain - Right/Left: Right Pain - part of body: Leg;Ankle and joints of foot;Knee   Activity Tolerance Patient tolerated treatment well   Patient Left in bed;with call bell/phone within reach   Nurse Communication Mobility status        Time: 6237-6283 OT Time Calculation (min): 24 min  Charges: OT General Charges $OT Visit: 1 Visit OT Treatments $Self Care/Home Management : 8-22 mins  Alfonse Flavors, OTA Acute Rehabilitation Services  Pager [phone] Office [phone]   Dewain Penning 08/02/2021, 1:06 PM

## 2021-08-03 MED ORDER — GABAPENTIN 100 MG PO CAPS
200.0000 mg | ORAL_CAPSULE | Freq: Three times a day (TID) | ORAL | Status: DC
  Administered 2021-08-03 – 2021-08-04 (×5): 200 mg via ORAL
  Filled 2021-08-03 (×5): qty 2

## 2021-08-03 NOTE — TOC Initial Note (Signed)
Transition of Care [facility]) - Initial/Assessment Note    Patient Details  Name: William [name] MRN: [id number] Date of Birth: 26-May-1966  [facility]) CM/SW Contact:    Mearl Latin, LCSW Phone Number: 08/03/2021, 2:30 PM  Clinical Narrative:                 CSW reached out to financial counseling to screen patient for Medicaid.   Expected Discharge Plan: Skilled Nursing Facility Barriers to Discharge: Continued Medical Work up, SNF Pending payor source - LOG, SNF Pending bed offer, Unsafe home situation   Patient Goals and CMS Choice        Expected Discharge Plan and Services Expected Discharge Plan: Skilled Nursing Facility In-house Referral: Clinical Social Work     Living arrangements for the past 2 months: Single Family Home                                      Prior Living Arrangements/Services Living arrangements for the past 2 months: Single Family Home Lives with:: Self, Siblings Patient language and need for interpreter reviewed:: Yes        Need for Family Participation in Patient Care: Yes (Comment) Care giver support system in place?: No (comment)   Criminal Activity/Legal Involvement Pertinent to Current Situation/Hospitalization: No - Comment as needed  Activities of Daily Living Home Assistive Devices/Equipment: Crutches ADL Screening (condition at time of admission) Patient's cognitive ability adequate to safely complete daily activities?: Yes Is the patient deaf or have difficulty hearing?: No Does the patient have difficulty seeing, even when wearing glasses/contacts?: Yes Does the patient have difficulty concentrating, remembering, or making decisions?: No Patient able to express need for assistance with ADLs?: Yes Does the patient have difficulty dressing or bathing?: Yes Independently performs ADLs?: No Communication: Independent Dressing (OT): Needs assistance Is this a change from baseline?: Change from baseline,  expected to last >3 days Grooming: Needs assistance Is this a change from baseline?: Change from baseline, expected to last >3 days Feeding: Independent Bathing: Needs assistance Is this a change from baseline?: Change from baseline, expected to last >3 days Toileting: Needs assistance Is this a change from baseline?: Change from baseline, expected to last <3 days In/Out Bed: Needs assistance Is this a change from baseline?: Change from baseline, expected to last >3 days Walks in Home: Needs assistance Is this a change from baseline?: Change from baseline, expected to last >3 days Does the patient have difficulty walking or climbing stairs?: Yes Weakness of Legs: Both Weakness of Arms/Hands: None  Permission Sought/Granted                  Emotional Assessment Appearance:: Appears stated age     Orientation: : Oriented to Self, Oriented to Place, Oriented to  Time, Oriented to Situation Alcohol / Substance Use: Not Applicable Psych Involvement: No (comment)  Admission diagnosis:  SIRS (systemic inflammatory response syndrome) (HCC) [R65.10] Generalized weakness [R53.1] Febrile illness, acute [R50.9] LFT elevation [R79.89] Gout of multiple sites, unspecified cause, unspecified chronicity [M10.9] Patient Active Problem List   Diagnosis Date Noted   Generalized weakness 07/30/2021   SIRS (systemic inflammatory response syndrome) (HCC) 07/30/2021   Pressure injury of skin 07/30/2021   Choledocholithiasis 04/05/2017   Liver failure (HCC) 04/04/2017   History of gout 04/04/2017   Elevated blood-pressure reading without diagnosis of hypertension 04/04/2017   Acute hyponatremia 04/04/2017  Acute hyperglycemia 04/04/2017   Alcoholism (HCC) 04/04/2017   Acute alcoholic hepatitis 04/04/2017   Cholelithiasis 04/04/2017   Dilated cbd, acquired    Jaundice    Elevated LFTs    PCP:  Patient, No Pcp Per (Inactive) Pharmacy:   [facility] [address] ([address]), [place] - [address] [place] ([place]) [place] [address] Phone: [phone] Fax: [phone]     Social Determinants of Health (SDOH) Interventions    Readmission Risk Interventions No flowsheet data found.

## 2021-08-03 NOTE — Progress Notes (Signed)
PROGRESS NOTE  William [name] EXH:[id number] DOB: 1966/01/24 DOA: 07/30/2021 PCP: Patient, No Pcp Per (Inactive)  HPI/Recap of past 24 hours: William [name] is a 55 y.o. male with medical history significant of EtOH abuse, gout.  Recently seen in ED 6 days ago with pain in his wrists and legs bilaterally, diagnosed with gout flare, started on colchicine.  He was seen by visiting nurse at home prior to his presentation and was found to be generally weak.  Had not been off the couch for a while, urinating and stooling self.  Reported decubitus ulcer on backside.  Complaining of generalized pain involving both legs both wrists.  Symptoms are constant, severe, and worsening.  Reviewed imagings, including CT scan, could not completely rule out community-acquired pneumonia.  Procalcitonin elevated 0.72, treated empirically with Rocephin and p.o. doxycycline.  08/03/2021: Seen and examined at his bedside this morning.  He reports more pain in his lower extremities bilaterally.  Dose of gabapentin increased.  PT assessed with recommendation for SNF, pending payer source.  Patient has an unsafe home situation.    Assessment/Plan: Principal Problem:   SIRS (systemic inflammatory response syndrome) (HCC) Active Problems:   History of gout   Alcoholism (HCC)   Elevated LFTs   Generalized weakness   Pressure injury of skin  SIRS , could not rule out CAP, concern for developing CAP, poa- Continue gentle IV fluid hydration, Rocephin and p.o. doxycycline x5 days DuoNebs every 6 hours. Mobilize as tolerated Incentive spirometer. Gout/polyneuropathy Cont colchicine Increased dose of gabapentin to 200 mg 3 times daily. EtOH abuse -   Outside of window for withdrawal.  No evidence of alcohol withdrawal at time of this visit. Continue MVM, Thiamine and folic acid supplements LFT elevation - RUQ [place] and CT AP = no acute findings, gallstones seen on RUQ [place] Acute hepatitis panel negative. Pressure injury  -seen by wound care specialist.  Continue local wound care with the guidance of wound care specialist. Dental caries, chronic-will need to follow-up with dental care outpatient.   DVT prophylaxis: Lovenox SQ daily Code Status: Full Family Communication: No family at bedside. Disposition Plan: Likely to home on 08/03/2021. Consults called: None Admission status: Inpatient status.  Patient will require at least 2 midnights for further evaluation and treatment of present condition.    Procedures: None   Antimicrobials: Rocephin 07/31/21 Po Doxycycline 07/31/21    Objective: Vitals:   08/02/21 2319 08/03/21 0337 08/03/21 0744 08/03/21 1312  BP: 120/73 118/74 123/80 126/77  Pulse: 97 88 87 84  Resp: 20 20 20 12   Temp: 97.6 F (36.4 C)  98.1 F (36.7 C) 98 F (36.7 C)  TempSrc: Oral  Oral Oral  SpO2: 94% 92% 95% 94%  Weight:      Height:        Intake/Output Summary (Last 24 hours) at 08/03/2021 1532 Last data filed at 08/03/2021 0900 Gross per 24 hour  Intake 1547.25 ml  Output 1575 ml  Net -27.75 ml   Filed Weights   07/30/21 1639 07/31/21 2132  Weight: 81.6 kg 81.5 kg    Exam:  General: 55 y.o. year-old male well-developed well-nourished in no acute distress.  He is alert and oriented x3. Cardiovascular: Regular rate and rhythm no rubs or gallops.  Respiratory: Clear to auscultation no wheezes or rales.  Good inspiratory effort.   Abdomen: Soft Nontender Normal Bowel Sounds Present. \ Musculoskeletal: Trace lower extremity edema bilaterally.   Skin: No ulcerative lesions noted. Psychiatry: Mood  is appropriate for condition and setting.   Data Reviewed: CBC: Recent Labs  Lab 07/30/21 1711 07/30/21 1937 07/31/21 0010 08/01/21 0215  WBC 12.4*  --  9.6 6.7  NEUTROABS 9.7*  --   --   --   HGB 14.4 12.6* 12.9* 11.5*  HCT 42.9 37.0* 41.5 35.8*  MCV 93.3  --  99.5 94.5  PLT 449*  --  361 320   Basic Metabolic Panel: Recent Labs  Lab 07/30/21 1711  07/30/21 1937 07/31/21 0010 08/01/21 0215  NA 136 138 136 130*  K 4.2 4.0 4.1 3.5  CL 103 106 106 102  CO2 19*  --  21* 21*  GLUCOSE 108* 118* 109* 101*  BUN 25* 24* 23* 15  CREATININE 0.99 0.80 0.97 0.77  CALCIUM 9.6  --  9.2 8.5*  MG 2.5*  --   --  2.4  PHOS  --   --   --  3.7   GFR: Estimated Creatinine Clearance: 111.1 mL/min (by C-G formula based on SCr of 0.77 mg/dL). Liver Function Tests: Recent Labs  Lab 07/30/21 1711 07/31/21 0010 08/01/21 0215  AST 102* 73* 41  ALT 100* 84* 58*  ALKPHOS 353* 296* 250*  BILITOT 2.4* 2.0* 1.3*  PROT 7.6 6.7 5.9*  ALBUMIN 2.7* 2.4* 2.2*   Recent Labs  Lab 07/30/21 1711  LIPASE 30   Recent Labs  Lab 07/30/21 2348  AMMONIA 42*   Coagulation Profile: Recent Labs  Lab 07/30/21 1711  INR 1.1   Cardiac Enzymes: Recent Labs  Lab 07/30/21 1711  CKTOTAL 157   BNP (last 3 results) No results for input(s): PROBNP in the last 8760 hours. HbA1C: No results for input(s): HGBA1C in the last 72 hours. CBG: No results for input(s): GLUCAP in the last 168 hours. Lipid Profile: No results for input(s): CHOL, HDL, LDLCALC, TRIG, CHOLHDL, LDLDIRECT in the last 72 hours. Thyroid Function Tests: No results for input(s): TSH, T4TOTAL, FREET4, T3FREE, THYROIDAB in the last 72 hours. Anemia Panel: No results for input(s): VITAMINB12, FOLATE, FERRITIN, TIBC, IRON, RETICCTPCT in the last 72 hours. Urine analysis:    Component Value Date/Time   COLORURINE AMBER (A) 07/30/2021 1653   APPEARANCEUR CLEAR 07/30/2021 1653   LABSPEC 1.021 07/30/2021 1653   PHURINE 7.0 07/30/2021 1653   GLUCOSEU NEGATIVE 07/30/2021 1653   HGBUR NEGATIVE 07/30/2021 1653   BILIRUBINUR NEGATIVE 07/30/2021 1653   KETONESUR NEGATIVE 07/30/2021 1653   PROTEINUR NEGATIVE 07/30/2021 1653   UROBILINOGEN 0.2 01/26/2015 0903   NITRITE NEGATIVE 07/30/2021 1653   LEUKOCYTESUR NEGATIVE 07/30/2021 1653   Sepsis  Labs: @LABRCNTIP (procalcitonin:4,lacticidven:4)  ) Recent Results (from the past 240 hour(s))  Blood Culture (routine x 2)     Status: None (Preliminary result)   Collection Time: 07/30/21  5:11 PM   Specimen: BLOOD LEFT ARM  Result Value Ref Range Status   Specimen Description BLOOD LEFT ARM  Final   Special Requests   Final    BOTTLES DRAWN AEROBIC AND ANAEROBIC Blood Culture results may not be optimal due to an excessive volume of blood received in culture bottles   Culture   Final    NO GROWTH 4 DAYS Performed at [facility], [address] [place], [place]    Report Status PENDING  Incomplete  Urine Culture     Status: Abnormal   Collection Time: 07/30/21  5:50 PM   Specimen: In/Out Cath Urine  Result Value Ref Range Status   Specimen Description IN/OUT  CATH URINE  Final   Special Requests   Final    NONE Performed at [facility], [address] [place], [place] [address]    Culture MULTIPLE SPECIES PRESENT, SUGGEST RECOLLECTION (A)  Final   Report Status 07/31/2021 FINAL  Final  Resp Panel by RT-PCR (Flu A&B, Covid) In/Out Cath Urine     Status: None   Collection Time: 07/30/21  5:50 PM   Specimen: In/Out Cath Urine; Nasopharyngeal(NP) swabs in vial transport medium  Result Value Ref Range Status   SARS Coronavirus 2 by RT PCR NEGATIVE NEGATIVE Final    Comment: (NOTE) SARS-CoV-2 target nucleic acids are NOT DETECTED.  The SARS-CoV-2 RNA is generally detectable in upper respiratory specimens during the acute phase of infection. The lowest concentration of SARS-CoV-2 viral copies this assay can detect is 138 copies/mL. A negative result does not preclude SARS-Cov-2 infection and should not be used as the sole basis for treatment or other patient management decisions. A negative result may occur with  improper specimen collection/handling, submission of specimen other than nasopharyngeal swab, presence of viral mutation(s) within the areas  targeted by this assay, and inadequate number of viral copies(<138 copies/mL). A negative result must be combined with clinical observations, patient history, and epidemiological information. The expected result is Negative.  Fact Sheet for Patients:  BloggerCourse.com  Fact Sheet for Healthcare Providers:  SeriousBroker.it  This test is no t yet approved or cleared by the [place] FDA and  has been authorized for detection and/or diagnosis of SARS-CoV-2 by FDA under an Emergency Use Authorization (EUA). This EUA will remain  in effect (meaning this test can be used) for the duration of the COVID-19 declaration under Section 564(b)(1) of the Act, 21 U.S.C.section 360bbb-3(b)(1), unless the authorization is terminated  or revoked sooner.       Influenza A by PCR NEGATIVE NEGATIVE Final   Influenza B by PCR NEGATIVE NEGATIVE Final    Comment: (NOTE) The Xpert Xpress SARS-CoV-2/FLU/RSV plus assay is intended as an aid in the diagnosis of influenza from Nasopharyngeal swab specimens and should not be used as a sole basis for treatment. Nasal washings and aspirates are unacceptable for Xpert Xpress SARS-CoV-2/FLU/RSV testing.  Fact Sheet for Patients: BloggerCourse.com  Fact Sheet for Healthcare Providers: SeriousBroker.it  This test is not yet approved or cleared by the [place] FDA and has been authorized for detection and/or diagnosis of SARS-CoV-2 by FDA under an Emergency Use Authorization (EUA). This EUA will remain in effect (meaning this test can be used) for the duration of the COVID-19 declaration under Section 564(b)(1) of the Act, 21 U.S.C. section 360bbb-3(b)(1), unless the authorization is terminated or revoked.  Performed at [facility], [address], [place], [place] [address]   Blood Culture (routine x 2)     Status: None (Preliminary result)    Collection Time: 07/30/21 10:31 PM   Specimen: BLOOD  Result Value Ref Range Status   Specimen Description BLOOD BLOOD RIGHT HAND  Final   Special Requests   Final    BOTTLES DRAWN AEROBIC AND ANAEROBIC Blood Culture adequate volume   Culture   Final    NO GROWTH 4 DAYS Performed at [facility], [address] [place], [place] [address]    Report Status PENDING  Incomplete  MRSA Next Gen by PCR, Nasal     Status: None   Collection Time: 07/31/21  1:01 PM   Specimen: Nasal Mucosa; Nasal Swab  Result Value Ref Range Status   MRSA by PCR Next Gen NOT DETECTED NOT DETECTED Final    Comment: (NOTE) The GeneXpert MRSA Assay (FDA approved for NASAL specimens only), is one component of a comprehensive MRSA colonization surveillance program. It is not intended to diagnose MRSA infection nor to guide or monitor treatment for MRSA infections. Test performance is not FDA approved in patients less than 84 years old. Performed at [facility], [address], [place], [place] [address]       Studies: No results found.  Scheduled Meds:  colchicine  0.6 mg Oral Daily   doxycycline  100 mg Oral Q12H   enoxaparin (LOVENOX) injection  40 mg Subcutaneous Q24H   feeding supplement  237 mL Oral TID BM   folic acid  1 mg Oral Daily   gabapentin  200 mg Oral TID   Gerhardt's butt cream   Topical QID   multivitamin with minerals  1 tablet Oral Daily   pantoprazole  40 mg Oral Daily   thiamine  100 mg Oral Daily   Or   thiamine  100 mg Intravenous Daily    Continuous Infusions:  cefTRIAXone (ROCEPHIN)  IV 1 g (08/03/21 1411)     LOS: 3 days     Darlin Drop, MD [facility] Pager [phone]  If 7PM-7AM, please contact night-coverage [email or web address] Password [facility] 08/03/2021, 3:32 PM

## 2021-08-04 DIAGNOSIS — E43 Unspecified severe protein-calorie malnutrition: Secondary | ICD-10-CM | POA: Insufficient documentation

## 2021-08-04 LAB — CULTURE, BLOOD (ROUTINE X 2)
Culture: NO GROWTH
Culture: NO GROWTH
Special Requests: ADEQUATE

## 2021-08-04 MED ORDER — OXYCODONE HCL 5 MG PO TABS
5.0000 mg | ORAL_TABLET | ORAL | Status: AC | PRN
  Administered 2021-08-05 (×2): 10 mg via ORAL
  Filled 2021-08-04 (×2): qty 2

## 2021-08-04 NOTE — Progress Notes (Signed)
PROGRESS NOTE  William [name]:[id number] DOB: October 03, 1966 DOA: 07/30/2021 PCP: Patient, No Pcp Per (Inactive)  HPI/Recap of past 24 hours: William [name] is a 55 y.o. male with medical history significant of EtOH abuse, gout.  Recently seen in ED 6 days ago with pain in his wrists and legs bilaterally, diagnosed with gout flare, started on colchicine.  He was seen by visiting nurse at home prior to his presentation and was found to be generally weak.  Had not been off the couch for a while, urinating and stooling self.  Reported decubitus ulcer on backside.  Complaining of generalized pain involving both legs both wrists.  Symptoms are constant, severe, and worsening.  Reviewed imagings, including CT scan, could not completely rule out community-acquired pneumonia.  Procalcitonin elevated 0.72, treated empirically with Rocephin and p.o. doxycycline.  PT assessed with recommendation for SNF, pending payer source.  Patient has an unsafe home situation.  08/04/2021: Seen in his bedside.  No new complaints.  Bilateral lower extremity pain is improved on increased dose of gabapentin.   Assessment/Plan: Principal Problem:   SIRS (systemic inflammatory response syndrome) (HCC) Active Problems:   History of gout   Alcoholism (HCC)   Elevated LFTs   Generalized weakness   Pressure injury of skin  SIRS , could not rule out CAP, concern for developing CAP, poa- Off IV fluid hydration  Rocephin and p.o. doxycycline x5 days, day 5. DuoNebs every 6 hours. Continue to Mobilize as tolerated Continue incentive spirometer. Gout/polyneuropathy Cont colchicine Continue gabapentin to 200 mg 3 times daily. EtOH abuse -   Outside of window for withdrawal.  No evidence of alcohol withdrawal at time of this visit. Continue MVM, Thiamine and folic acid supplements LFT elevation - RUQ [place] and CT AP = no acute findings, gallstones seen on RUQ [place] Acute hepatitis panel negative. Pressure injury -seen by  wound care specialist.  Continue local wound care with the guidance of wound care specialist. Dental caries, chronic-will need to follow-up with dental care outpatient.   DVT prophylaxis: Lovenox SQ daily Code Status: Full Family Communication: No family at bedside. Disposition Plan: Likely to home on 08/03/2021. Consults called: None Admission status: Inpatient status.  Patient will require at least 2 midnights for further evaluation and treatment of present condition.    Procedures: None   Antimicrobials: Rocephin 07/31/21 Po Doxycycline 07/31/21    Objective: Vitals:   08/04/21 0307 08/04/21 0737 08/04/21 0800 08/04/21 1224  BP: 122/78 107/75 125/71 114/88  Pulse: 82 75 82 88  Resp: 20 20 16 19   Temp: 97.8 F (36.6 C) 97.8 F (36.6 C)  98.3 F (36.8 C)  TempSrc: Oral Oral  Oral  SpO2: 94% 95% 95% 96%  Weight:      Height:        Intake/Output Summary (Last 24 hours) at 08/04/2021 1511 Last data filed at 08/04/2021 1230 Gross per 24 hour  Intake 480 ml  Output 1325 ml  Net -845 ml   Filed Weights   07/30/21 1639 07/31/21 2132  Weight: 81.6 kg 81.5 kg    Exam:  General: 55 y.o. year-old male well-developed well-nourished in no acute distress.  He is alert and oriented x3.   Cardiovascular: Regular rate and rhythm no rubs or gallops.  Respiratory: Clear to auscultation with no wheezes or rales. Abdomen: Soft nontender normal bowel sounds present. Musculoskeletal: Trace lower extremity edema bilaterally.   Skin: No ulcerative lesions noted. Psychiatry: Mood is appropriate for condition set  up   Data Reviewed: CBC: Recent Labs  Lab 07/30/21 1711 07/30/21 1937 07/31/21 0010 08/01/21 0215  WBC 12.4*  --  9.6 6.7  NEUTROABS 9.7*  --   --   --   HGB 14.4 12.6* 12.9* 11.5*  HCT 42.9 37.0* 41.5 35.8*  MCV 93.3  --  99.5 94.5  PLT 449*  --  361 320   Basic Metabolic Panel: Recent Labs  Lab 07/30/21 1711 07/30/21 1937 07/31/21 0010 08/01/21 0215   NA 136 138 136 130*  K 4.2 4.0 4.1 3.5  CL 103 106 106 102  CO2 19*  --  21* 21*  GLUCOSE 108* 118* 109* 101*  BUN 25* 24* 23* 15  CREATININE 0.99 0.80 0.97 0.77  CALCIUM 9.6  --  9.2 8.5*  MG 2.5*  --   --  2.4  PHOS  --   --   --  3.7   GFR: Estimated Creatinine Clearance: 111.1 mL/min (by C-G formula based on SCr of 0.77 mg/dL). Liver Function Tests: Recent Labs  Lab 07/30/21 1711 07/31/21 0010 08/01/21 0215  AST 102* 73* 41  ALT 100* 84* 58*  ALKPHOS 353* 296* 250*  BILITOT 2.4* 2.0* 1.3*  PROT 7.6 6.7 5.9*  ALBUMIN 2.7* 2.4* 2.2*   Recent Labs  Lab 07/30/21 1711  LIPASE 30   Recent Labs  Lab 07/30/21 2348  AMMONIA 42*   Coagulation Profile: Recent Labs  Lab 07/30/21 1711  INR 1.1   Cardiac Enzymes: Recent Labs  Lab 07/30/21 1711  CKTOTAL 157   BNP (last 3 results) No results for input(s): PROBNP in the last 8760 hours. HbA1C: No results for input(s): HGBA1C in the last 72 hours. CBG: No results for input(s): GLUCAP in the last 168 hours. Lipid Profile: No results for input(s): CHOL, HDL, LDLCALC, TRIG, CHOLHDL, LDLDIRECT in the last 72 hours. Thyroid Function Tests: No results for input(s): TSH, T4TOTAL, FREET4, T3FREE, THYROIDAB in the last 72 hours. Anemia Panel: No results for input(s): VITAMINB12, FOLATE, FERRITIN, TIBC, IRON, RETICCTPCT in the last 72 hours. Urine analysis:    Component Value Date/Time   COLORURINE AMBER (A) 07/30/2021 1653   APPEARANCEUR CLEAR 07/30/2021 1653   LABSPEC 1.021 07/30/2021 1653   PHURINE 7.0 07/30/2021 1653   GLUCOSEU NEGATIVE 07/30/2021 1653   HGBUR NEGATIVE 07/30/2021 1653   BILIRUBINUR NEGATIVE 07/30/2021 1653   KETONESUR NEGATIVE 07/30/2021 1653   PROTEINUR NEGATIVE 07/30/2021 1653   UROBILINOGEN 0.2 01/26/2015 0903   NITRITE NEGATIVE 07/30/2021 1653   LEUKOCYTESUR NEGATIVE 07/30/2021 1653   Sepsis Labs: @LABRCNTIP (procalcitonin:4,lacticidven:4)  ) Recent Results (from the past 240 hour(s))   Blood Culture (routine x 2)     Status: None   Collection Time: 07/30/21  5:11 PM   Specimen: BLOOD LEFT ARM  Result Value Ref Range Status   Specimen Description BLOOD LEFT ARM  Final   Special Requests   Final    BOTTLES DRAWN AEROBIC AND ANAEROBIC Blood Culture results may not be optimal due to an excessive volume of blood received in culture bottles   Culture   Final    NO GROWTH 5 DAYS Performed at [facility], [address] [place], [place]    Report Status 08/04/2021 FINAL  Final  Urine Culture     Status: Abnormal   Collection Time: 07/30/21  5:50 PM   Specimen: In/Out Cath Urine  Result Value Ref Range Status   Specimen Description IN/OUT CATH URINE  Final  Special Requests   Final    NONE Performed at [facility], [address], [place], [place] [address]    Culture MULTIPLE SPECIES PRESENT, SUGGEST RECOLLECTION (A)  Final   Report Status 07/31/2021 FINAL  Final  Resp Panel by RT-PCR (Flu A&B, Covid) In/Out Cath Urine     Status: None   Collection Time: 07/30/21  5:50 PM   Specimen: In/Out Cath Urine; Nasopharyngeal(NP) swabs in vial transport medium  Result Value Ref Range Status   SARS Coronavirus 2 by RT PCR NEGATIVE NEGATIVE Final    Comment: (NOTE) SARS-CoV-2 target nucleic acids are NOT DETECTED.  The SARS-CoV-2 RNA is generally detectable in upper respiratory specimens during the acute phase of infection. The lowest concentration of SARS-CoV-2 viral copies this assay can detect is 138 copies/mL. A negative result does not preclude SARS-Cov-2 infection and should not be used as the sole basis for treatment or other patient management decisions. A negative result may occur with  improper specimen collection/handling, submission of specimen other than nasopharyngeal swab, presence of viral mutation(s) within the areas targeted by this assay, and inadequate number of viral copies(<138 copies/mL). A negative result must be combined  with clinical observations, patient history, and epidemiological information. The expected result is Negative.  Fact Sheet for Patients:  BloggerCourse.com  Fact Sheet for Healthcare Providers:  SeriousBroker.it  This test is no t yet approved or cleared by the [place] FDA and  has been authorized for detection and/or diagnosis of SARS-CoV-2 by FDA under an Emergency Use Authorization (EUA). This EUA will remain  in effect (meaning this test can be used) for the duration of the COVID-19 declaration under Section 564(b)(1) of the Act, 21 U.S.C.section 360bbb-3(b)(1), unless the authorization is terminated  or revoked sooner.       Influenza A by PCR NEGATIVE NEGATIVE Final   Influenza B by PCR NEGATIVE NEGATIVE Final    Comment: (NOTE) The Xpert Xpress SARS-CoV-2/FLU/RSV plus assay is intended as an aid in the diagnosis of influenza from Nasopharyngeal swab specimens and should not be used as a sole basis for treatment. Nasal washings and aspirates are unacceptable for Xpert Xpress SARS-CoV-2/FLU/RSV testing.  Fact Sheet for Patients: BloggerCourse.com  Fact Sheet for Healthcare Providers: SeriousBroker.it  This test is not yet approved or cleared by the [place] FDA and has been authorized for detection and/or diagnosis of SARS-CoV-2 by FDA under an Emergency Use Authorization (EUA). This EUA will remain in effect (meaning this test can be used) for the duration of the COVID-19 declaration under Section 564(b)(1) of the Act, 21 U.S.C. section 360bbb-3(b)(1), unless the authorization is terminated or revoked.  Performed at [facility], [address] [place], [place] [address]   Blood Culture (routine x 2)     Status: None   Collection Time: 07/30/21 10:31 PM   Specimen: BLOOD  Result Value Ref Range Status   Specimen Description BLOOD BLOOD RIGHT HAND   Final   Special Requests   Final    BOTTLES DRAWN AEROBIC AND ANAEROBIC Blood Culture adequate volume   Culture   Final    NO GROWTH 5 DAYS Performed at [facility], [address], [place], [place] [address]    Report Status 08/04/2021 FINAL  Final  MRSA Next Gen by PCR, Nasal     Status: None   Collection Time: 07/31/21  1:01 PM   Specimen: Nasal Mucosa; Nasal Swab  Result Value Ref Range Status  MRSA by PCR Next Gen NOT DETECTED NOT DETECTED Final    Comment: (NOTE) The GeneXpert MRSA Assay (FDA approved for NASAL specimens only), is one component of a comprehensive MRSA colonization surveillance program. It is not intended to diagnose MRSA infection nor to guide or monitor treatment for MRSA infections. Test performance is not FDA approved in patients less than 107 years old. Performed at [facility], [address] [place], [place] [address]       Studies: No results found.  Scheduled Meds:  colchicine  0.6 mg Oral Daily   doxycycline  100 mg Oral Q12H   enoxaparin (LOVENOX) injection  40 mg Subcutaneous Q24H   feeding supplement  237 mL Oral TID BM   folic acid  1 mg Oral Daily   gabapentin  200 mg Oral TID   Gerhardt's butt cream   Topical QID   multivitamin with minerals  1 tablet Oral Daily   pantoprazole  40 mg Oral Daily   thiamine  100 mg Oral Daily   Or   thiamine  100 mg Intravenous Daily    Continuous Infusions:  cefTRIAXone (ROCEPHIN)  IV 1 g (08/04/21 1440)     LOS: 4 days     Darlin Drop, MD [facility] Pager [phone]  If 7PM-7AM, please contact night-coverage [email or web address] Password [email or web address] 08/04/2021, 3:11 PM

## 2021-08-05 MED ORDER — GABAPENTIN 300 MG PO CAPS
300.0000 mg | ORAL_CAPSULE | Freq: Three times a day (TID) | ORAL | Status: DC
  Administered 2021-08-05 – 2021-08-08 (×10): 300 mg via ORAL
  Filled 2021-08-05 (×10): qty 1

## 2021-08-05 NOTE — Progress Notes (Signed)
PROGRESS NOTE  William [name]:[id number] DOB: 08-22-1966 DOA: 07/30/2021 PCP: Patient, No Pcp Per (Inactive)  HPI/Recap of past 24 hours: William [name] is a 55 y.o. male with medical history significant of EtOH abuse, gout.  Recently seen in ED 6 days ago with pain in his wrists and legs bilaterally, diagnosed with gout flare, started on colchicine.  He was seen by visiting nurse at home prior to his presentation and was found to be generally weak.  Had not been off the couch for a while, urinating and stooling self.  Reported decubitus ulcer on backside.  Complaining of generalized pain involving both legs both wrists.  Symptoms are constant, severe, and worsening.  Reviewed imagings, including CT scan, could not completely rule out community-acquired pneumonia.  Procalcitonin elevated 0.72, treated empirically with Rocephin and p.o. doxycycline.  PT assessed with recommendation for SNF, pending payer source.  Patient has an unsafe home situation.  States he lives with roommates and they are not able to assist him.   08/05/2021: Seen at his bedside.  Gabapentin dose increased to 300 mg 3 times daily for bilateral lower extremity neuropathic pain.  Assessment/Plan: Principal Problem:   SIRS (systemic inflammatory response syndrome) (HCC) Active Problems:   History of gout   Alcoholism (HCC)   Elevated LFTs   Generalized weakness   Pressure injury of skin   Protein-calorie malnutrition, severe   SIRS , could not rule out CAP, concern for developing CAP, poa- Off IV fluid hydration Completed 5 days of Rocephin and doxycycline. DuoNebs every 6 hours. Continue to Mobilize as tolerated Continue incentive spirometer. Gout/polyneuropathy Cont colchicine Gabapentin dose increased to 300 mg 3 times daily. EtOH abuse -   Outside of window for withdrawal.  No evidence of alcohol withdrawal at time of this visit. Continue MVM, Thiamine and folic acid supplements LFT elevation - RUQ [place] and  CT AP = no acute findings, gallstones seen on RUQ [place] Acute hepatitis panel negative. Pressure injury -seen by wound care specialist.  Continue local wound care. Dental caries, chronic-will need to follow-up with dental care outpatient.   DVT prophylaxis: Lovenox SQ daily Code Status: Full Family Communication: No family at bedside. Disposition Plan: Likely to home on 08/03/2021. Consults called: None Admission status: Inpatient status.  Patient will require at least 2 midnights for further evaluation and treatment of present condition.     Procedures: None    Antimicrobials: Rocephin 07/31/21> 08/04/2021 Po Doxycycline 07/31/21> 08/04/2021.        Objective: Vitals:   08/05/21 0412 08/05/21 0700 08/05/21 0757 08/05/21 1207  BP: 128/83  116/77 114/74  Pulse: 85 83 82 89  Resp: 20 16 20 17   Temp: 98.2 F (36.8 C)  97.7 F (36.5 C) (!) 97.5 F (36.4 C)  TempSrc: Oral  Oral Oral  SpO2: 97% 94% 92% 91%  Weight:      Height:        Intake/Output Summary (Last 24 hours) at 08/05/2021 1246 Last data filed at 08/05/2021 0900 Gross per 24 hour  Intake 120 ml  Output 900 ml  Net -780 ml   Filed Weights   07/30/21 1639 07/31/21 2132  Weight: 81.6 kg 81.5 kg    Exam:  General: 55 y.o. year-old male well developed well nourished in no acute distress.  Alert and oriented x3. Cardiovascular: Regular rate and rhythm with no rubs or gallops.  No thyromegaly or JVD noted.   Respiratory: Clear to auscultation with no wheezes or rales.  Good inspiratory effort. Abdomen: Soft nontender nondistended with normal bowel sounds x4 quadrants. Musculoskeletal: No lower extremity edema. 2/4 pulses in all 4 extremities. Skin: No ulcerative lesions noted or rashes, Psychiatry: Mood is appropriate for condition and setting   Data Reviewed: CBC: Recent Labs  Lab 07/30/21 1711 07/30/21 1937 07/31/21 0010 08/01/21 0215  WBC 12.4*  --  9.6 6.7  NEUTROABS 9.7*  --   --   --   HGB  14.4 12.6* 12.9* 11.5*  HCT 42.9 37.0* 41.5 35.8*  MCV 93.3  --  99.5 94.5  PLT 449*  --  361 320   Basic Metabolic Panel: Recent Labs  Lab 07/30/21 1711 07/30/21 1937 07/31/21 0010 08/01/21 0215  NA 136 138 136 130*  K 4.2 4.0 4.1 3.5  CL 103 106 106 102  CO2 19*  --  21* 21*  GLUCOSE 108* 118* 109* 101*  BUN 25* 24* 23* 15  CREATININE 0.99 0.80 0.97 0.77  CALCIUM 9.6  --  9.2 8.5*  MG 2.5*  --   --  2.4  PHOS  --   --   --  3.7   GFR: Estimated Creatinine Clearance: 111.1 mL/min (by C-G formula based on SCr of 0.77 mg/dL). Liver Function Tests: Recent Labs  Lab 07/30/21 1711 07/31/21 0010 08/01/21 0215  AST 102* 73* 41  ALT 100* 84* 58*  ALKPHOS 353* 296* 250*  BILITOT 2.4* 2.0* 1.3*  PROT 7.6 6.7 5.9*  ALBUMIN 2.7* 2.4* 2.2*   Recent Labs  Lab 07/30/21 1711  LIPASE 30   Recent Labs  Lab 07/30/21 2348  AMMONIA 42*   Coagulation Profile: Recent Labs  Lab 07/30/21 1711  INR 1.1   Cardiac Enzymes: Recent Labs  Lab 07/30/21 1711  CKTOTAL 157   BNP (last 3 results) No results for input(s): PROBNP in the last 8760 hours. HbA1C: No results for input(s): HGBA1C in the last 72 hours. CBG: No results for input(s): GLUCAP in the last 168 hours. Lipid Profile: No results for input(s): CHOL, HDL, LDLCALC, TRIG, CHOLHDL, LDLDIRECT in the last 72 hours. Thyroid Function Tests: No results for input(s): TSH, T4TOTAL, FREET4, T3FREE, THYROIDAB in the last 72 hours. Anemia Panel: No results for input(s): VITAMINB12, FOLATE, FERRITIN, TIBC, IRON, RETICCTPCT in the last 72 hours. Urine analysis:    Component Value Date/Time   COLORURINE AMBER (A) 07/30/2021 1653   APPEARANCEUR CLEAR 07/30/2021 1653   LABSPEC 1.021 07/30/2021 1653   PHURINE 7.0 07/30/2021 1653   GLUCOSEU NEGATIVE 07/30/2021 1653   HGBUR NEGATIVE 07/30/2021 1653   BILIRUBINUR NEGATIVE 07/30/2021 1653   KETONESUR NEGATIVE 07/30/2021 1653   PROTEINUR NEGATIVE 07/30/2021 1653   UROBILINOGEN  0.2 01/26/2015 0903   NITRITE NEGATIVE 07/30/2021 1653   LEUKOCYTESUR NEGATIVE 07/30/2021 1653   Sepsis Labs: @LABRCNTIP (procalcitonin:4,lacticidven:4)  ) Recent Results (from the past 240 hour(s))  Blood Culture (routine x 2)     Status: None   Collection Time: 07/30/21  5:11 PM   Specimen: BLOOD LEFT ARM  Result Value Ref Range Status   Specimen Description BLOOD LEFT ARM  Final   Special Requests   Final    BOTTLES DRAWN AEROBIC AND ANAEROBIC Blood Culture results may not be optimal due to an excessive volume of blood received in culture bottles   Culture   Final    NO GROWTH 5 DAYS Performed at [facility], [address] [place], [place]    Report Status 08/04/2021 FINAL  Final  Urine  Culture     Status: Abnormal   Collection Time: 07/30/21  5:50 PM   Specimen: In/Out Cath Urine  Result Value Ref Range Status   Specimen Description IN/OUT CATH URINE  Final   Special Requests   Final    NONE Performed at [facility], [address] [place], [place] [address]    Culture MULTIPLE SPECIES PRESENT, SUGGEST RECOLLECTION (A)  Final   Report Status 07/31/2021 FINAL  Final  Resp Panel by RT-PCR (Flu A&B, Covid) In/Out Cath Urine     Status: None   Collection Time: 07/30/21  5:50 PM   Specimen: In/Out Cath Urine; Nasopharyngeal(NP) swabs in vial transport medium  Result Value Ref Range Status   SARS Coronavirus 2 by RT PCR NEGATIVE NEGATIVE Final    Comment: (NOTE) SARS-CoV-2 target nucleic acids are NOT DETECTED.  The SARS-CoV-2 RNA is generally detectable in upper respiratory specimens during the acute phase of infection. The lowest concentration of SARS-CoV-2 viral copies this assay can detect is 138 copies/mL. A negative result does not preclude SARS-Cov-2 infection and should not be used as the sole basis for treatment or other patient management decisions. A negative result may occur with  improper specimen collection/handling, submission of  specimen other than nasopharyngeal swab, presence of viral mutation(s) within the areas targeted by this assay, and inadequate number of viral copies(<138 copies/mL). A negative result must be combined with clinical observations, patient history, and epidemiological information. The expected result is Negative.  Fact Sheet for Patients:  BloggerCourse.com  Fact Sheet for Healthcare Providers:  SeriousBroker.it  This test is no t yet approved or cleared by the [place] FDA and  has been authorized for detection and/or diagnosis of SARS-CoV-2 by FDA under an Emergency Use Authorization (EUA). This EUA will remain  in effect (meaning this test can be used) for the duration of the COVID-19 declaration under Section 564(b)(1) of the Act, 21 U.S.C.section 360bbb-3(b)(1), unless the authorization is terminated  or revoked sooner.       Influenza A by PCR NEGATIVE NEGATIVE Final   Influenza B by PCR NEGATIVE NEGATIVE Final    Comment: (NOTE) The Xpert Xpress SARS-CoV-2/FLU/RSV plus assay is intended as an aid in the diagnosis of influenza from Nasopharyngeal swab specimens and should not be used as a sole basis for treatment. Nasal washings and aspirates are unacceptable for Xpert Xpress SARS-CoV-2/FLU/RSV testing.  Fact Sheet for Patients: BloggerCourse.com  Fact Sheet for Healthcare Providers: SeriousBroker.it  This test is not yet approved or cleared by the [place] FDA and has been authorized for detection and/or diagnosis of SARS-CoV-2 by FDA under an Emergency Use Authorization (EUA). This EUA will remain in effect (meaning this test can be used) for the duration of the COVID-19 declaration under Section 564(b)(1) of the Act, 21 U.S.C. section 360bbb-3(b)(1), unless the authorization is terminated or revoked.  Performed at [facility], [address],  [place], [place] [address]   Blood Culture (routine x 2)     Status: None   Collection Time: 07/30/21 10:31 PM   Specimen: BLOOD  Result Value Ref Range Status   Specimen Description BLOOD BLOOD RIGHT HAND  Final   Special Requests   Final    BOTTLES DRAWN AEROBIC AND ANAEROBIC Blood Culture adequate volume   Culture   Final    NO GROWTH 5 DAYS Performed at [facility], [address], [place], [place] [address]    Report Status 08/04/2021 FINAL  Final  MRSA Next Gen by PCR, Nasal     Status: None   Collection Time: 07/31/21  1:01 PM   Specimen: Nasal Mucosa; Nasal Swab  Result Value Ref Range Status   MRSA by PCR Next Gen NOT DETECTED NOT DETECTED Final    Comment: (NOTE) The GeneXpert MRSA Assay (FDA approved for NASAL specimens only), is one component of a comprehensive MRSA colonization surveillance program. It is not intended to diagnose MRSA infection nor to guide or monitor treatment for MRSA infections. Test performance is not FDA approved in patients less than 79 years old. Performed at [facility], [address], [place], [place] [address]       Studies: No results found.  Scheduled Meds:  colchicine  0.6 mg Oral Daily   enoxaparin (LOVENOX) injection  40 mg Subcutaneous Q24H   feeding supplement  237 mL Oral TID BM   folic acid  1 mg Oral Daily   gabapentin  300 mg Oral TID   Gerhardt's butt cream   Topical QID   multivitamin with minerals  1 tablet Oral Daily   pantoprazole  40 mg Oral Daily   thiamine  100 mg Oral Daily   Or   thiamine  100 mg Intravenous Daily    Continuous Infusions:  cefTRIAXone (ROCEPHIN)  IV 1 g (08/04/21 1440)     LOS: 5 days     Darlin Drop, MD [facility] Pager [phone]  If 7PM-7AM, please contact night-coverage [email or web address] Password [facility] 08/05/2021, 12:46 PM

## 2021-08-06 NOTE — Progress Notes (Signed)
PROGRESS NOTE  William [name] RKY:[id number] DOB: [birth year] DOA: 07/30/2021 PCP: Patient, No Pcp Per (Inactive)  HPI/Recap of past 24 hours: William [name] is a 55 y.o. male with medical history significant of EtOH abuse, gout.  Recently seen in ED 6 days ago with pain in his wrists and legs bilaterally, diagnosed with gout flare, started on colchicine.  He was seen by visiting nurse at home prior to his presentation and was found to be generally weak.  Had not been off the couch for a while, urinating and stooling self.  Reported decubitus ulcer on backside.  Complaining of generalized pain involving both legs both wrists.  Symptoms are constant, severe, and worsening.  Reviewed imagings, including CT scan, could not completely rule out community-acquired pneumonia.  Procalcitonin elevated 0.72, treated empirically with Rocephin and p.o. doxycycline.  PT assessed with recommendation for SNF, pending payer source.  Patient has an unsafe home situation.  States he lives with roommates and they are not able to assist him.   08/06/2021: Weak but willing to work with PT.  PT recommended SNF.  Assessment/Plan: Principal Problem:   SIRS (systemic inflammatory response syndrome) (HCC) Active Problems:   History of gout   Alcoholism (HCC)   Elevated LFTs   Generalized weakness   Pressure injury of skin   Protein-calorie malnutrition, severe   SIRS , could not rule out CAP, concern for developing CAP, poa- Off IV fluid hydration Completed 5 days of Rocephin and doxycycline. DuoNebs every 6 hours. Continue to Mobilize as tolerated Continue incentive spirometer. Gout/polyneuropathy Cont colchicine Gabapentin dose increased to 300 mg 3 times daily. EtOH abuse -   Outside of window for withdrawal.  No evidence of alcohol withdrawal at time of this visit. Continue MVM, Thiamine and folic acid supplements LFT elevation - RUQ [place] and CT AP = no acute findings, gallstones seen on RUQ [place] Acute  hepatitis panel negative. Pressure injury -seen by wound care specialist.  Continue local wound care. Dental caries, chronic-will need to follow-up with dental care outpatient.   DVT prophylaxis: Lovenox SQ daily Code Status: Full Family Communication: No family at bedside. Disposition Plan: Likely to home on 08/03/2021. Consults called: None Admission status: Inpatient status.  Patient will require at least 2 midnights for further evaluation and treatment of present condition.     Procedures: None    Antimicrobials: Rocephin 07/31/21> 08/04/2021 Po Doxycycline 07/31/21> 08/04/2021.        Objective: Vitals:   08/06/21 0450 08/06/21 0735 08/06/21 1139 08/06/21 1627  BP: 104/76 107/90 107/73 123/80  Pulse: 96 88 92 89  Resp: 19 15 20 17   Temp: 98.2 F (36.8 C) 98.4 F (36.9 C) 98.7 F (37.1 C) 98.4 F (36.9 C)  TempSrc: Oral Oral Axillary Oral  SpO2: 93% 95% 95% 93%  Weight:      Height:        Intake/Output Summary (Last 24 hours) at 08/06/2021 1714 Last data filed at 08/06/2021 1630 Gross per 24 hour  Intake 240 ml  Output 1225 ml  Net -985 ml   Filed Weights   07/30/21 1639 07/31/21 2132  Weight: 81.6 kg 81.5 kg    Exam: No significant changes from prior exam.  General: 55 y.o. year-old male well developed well nourished in no acute distress.  Alert and oriented x3. Cardiovascular: Regular rate and rhythm with no rubs or gallops.  No thyromegaly or JVD noted.   Respiratory: Clear to auscultation with no wheezes or rales. Good  inspiratory effort. Abdomen: Soft nontender nondistended with normal bowel sounds x4 quadrants. Musculoskeletal: No lower extremity edema. 2/4 pulses in all 4 extremities. Skin: No ulcerative lesions noted or rashes, Psychiatry: Mood is appropriate for condition and setting   Data Reviewed: CBC: Recent Labs  Lab 07/30/21 1937 07/31/21 0010 08/01/21 0215  WBC  --  9.6 6.7  HGB 12.6* 12.9* 11.5*  HCT 37.0* 41.5 35.8*  MCV   --  99.5 94.5  PLT  --  361 320   Basic Metabolic Panel: Recent Labs  Lab 07/30/21 1937 07/31/21 0010 08/01/21 0215  NA 138 136 130*  K 4.0 4.1 3.5  CL 106 106 102  CO2  --  21* 21*  GLUCOSE 118* 109* 101*  BUN 24* 23* 15  CREATININE 0.80 0.97 0.77  CALCIUM  --  9.2 8.5*  MG  --   --  2.4  PHOS  --   --  3.7   GFR: Estimated Creatinine Clearance: 111.1 mL/min (by C-G formula based on SCr of 0.77 mg/dL). Liver Function Tests: Recent Labs  Lab 07/31/21 0010 08/01/21 0215  AST 73* 41  ALT 84* 58*  ALKPHOS 296* 250*  BILITOT 2.0* 1.3*  PROT 6.7 5.9*  ALBUMIN 2.4* 2.2*   No results for input(s): LIPASE, AMYLASE in the last 168 hours.  Recent Labs  Lab 07/30/21 2348  AMMONIA 42*   Coagulation Profile: No results for input(s): INR, PROTIME in the last 168 hours.  Cardiac Enzymes: No results for input(s): CKTOTAL, CKMB, CKMBINDEX, TROPONINI in the last 168 hours.  BNP (last 3 results) No results for input(s): PROBNP in the last 8760 hours. HbA1C: No results for input(s): HGBA1C in the last 72 hours. CBG: No results for input(s): GLUCAP in the last 168 hours. Lipid Profile: No results for input(s): CHOL, HDL, LDLCALC, TRIG, CHOLHDL, LDLDIRECT in the last 72 hours. Thyroid Function Tests: No results for input(s): TSH, T4TOTAL, FREET4, T3FREE, THYROIDAB in the last 72 hours. Anemia Panel: No results for input(s): VITAMINB12, FOLATE, FERRITIN, TIBC, IRON, RETICCTPCT in the last 72 hours. Urine analysis:    Component Value Date/Time   COLORURINE AMBER (A) 07/30/2021 1653   APPEARANCEUR CLEAR 07/30/2021 1653   LABSPEC 1.021 07/30/2021 1653   PHURINE 7.0 07/30/2021 1653   GLUCOSEU NEGATIVE 07/30/2021 1653   HGBUR NEGATIVE 07/30/2021 1653   BILIRUBINUR NEGATIVE 07/30/2021 1653   KETONESUR NEGATIVE 07/30/2021 1653   PROTEINUR NEGATIVE 07/30/2021 1653   UROBILINOGEN 0.2 01/26/2015 0903   NITRITE NEGATIVE 07/30/2021 1653   LEUKOCYTESUR NEGATIVE 07/30/2021 1653    Sepsis Labs: @LABRCNTIP (procalcitonin:4,lacticidven:4)  ) Recent Results (from the past 240 hour(s))  Blood Culture (routine x 2)     Status: None   Collection Time: 07/30/21  5:11 PM   Specimen: BLOOD LEFT ARM  Result Value Ref Range Status   Specimen Description BLOOD LEFT ARM  Final   Special Requests   Final    BOTTLES DRAWN AEROBIC AND ANAEROBIC Blood Culture results may not be optimal due to an excessive volume of blood received in culture bottles   Culture   Final    NO GROWTH 5 DAYS Performed at [facility], [address] [place], [place]    Report Status 08/04/2021 FINAL  Final  Urine Culture     Status: Abnormal   Collection Time: 07/30/21  5:50 PM   Specimen: In/Out Cath Urine  Result Value Ref Range Status   Specimen Description IN/OUT CATH URINE  Final  Special Requests   Final    NONE Performed at [facility], [address], [place], [place] [address]    Culture MULTIPLE SPECIES PRESENT, SUGGEST RECOLLECTION (A)  Final   Report Status 07/31/2021 FINAL  Final  Resp Panel by RT-PCR (Flu A&B, Covid) In/Out Cath Urine     Status: None   Collection Time: 07/30/21  5:50 PM   Specimen: In/Out Cath Urine; Nasopharyngeal(NP) swabs in vial transport medium  Result Value Ref Range Status   SARS Coronavirus 2 by RT PCR NEGATIVE NEGATIVE Final    Comment: (NOTE) SARS-CoV-2 target nucleic acids are NOT DETECTED.  The SARS-CoV-2 RNA is generally detectable in upper respiratory specimens during the acute phase of infection. The lowest concentration of SARS-CoV-2 viral copies this assay can detect is 138 copies/mL. A negative result does not preclude SARS-Cov-2 infection and should not be used as the sole basis for treatment or other patient management decisions. A negative result may occur with  improper specimen collection/handling, submission of specimen other than nasopharyngeal swab, presence of viral mutation(s) within the areas targeted  by this assay, and inadequate number of viral copies(<138 copies/mL). A negative result must be combined with clinical observations, patient history, and epidemiological information. The expected result is Negative.  Fact Sheet for Patients:  BloggerCourse.com  Fact Sheet for Healthcare Providers:  SeriousBroker.it  This test is no t yet approved or cleared by the [place] FDA and  has been authorized for detection and/or diagnosis of SARS-CoV-2 by FDA under an Emergency Use Authorization (EUA). This EUA will remain  in effect (meaning this test can be used) for the duration of the COVID-19 declaration under Section 564(b)(1) of the Act, 21 U.S.C.section 360bbb-3(b)(1), unless the authorization is terminated  or revoked sooner.       Influenza A by PCR NEGATIVE NEGATIVE Final   Influenza B by PCR NEGATIVE NEGATIVE Final    Comment: (NOTE) The Xpert Xpress SARS-CoV-2/FLU/RSV plus assay is intended as an aid in the diagnosis of influenza from Nasopharyngeal swab specimens and should not be used as a sole basis for treatment. Nasal washings and aspirates are unacceptable for Xpert Xpress SARS-CoV-2/FLU/RSV testing.  Fact Sheet for Patients: BloggerCourse.com  Fact Sheet for Healthcare Providers: SeriousBroker.it  This test is not yet approved or cleared by the [place] FDA and has been authorized for detection and/or diagnosis of SARS-CoV-2 by FDA under an Emergency Use Authorization (EUA). This EUA will remain in effect (meaning this test can be used) for the duration of the COVID-19 declaration under Section 564(b)(1) of the Act, 21 U.S.C. section 360bbb-3(b)(1), unless the authorization is terminated or revoked.  Performed at [facility], [address] [place], [place] [address]   Blood Culture (routine x 2)     Status: None   Collection Time: 07/30/21  10:31 PM   Specimen: BLOOD  Result Value Ref Range Status   Specimen Description BLOOD BLOOD RIGHT HAND  Final   Special Requests   Final    BOTTLES DRAWN AEROBIC AND ANAEROBIC Blood Culture adequate volume   Culture   Final    NO GROWTH 5 DAYS Performed at [facility], [address], [place], [place] [address]    Report Status 08/04/2021 FINAL  Final  MRSA Next Gen by PCR, Nasal     Status: None   Collection Time: 07/31/21  1:01 PM   Specimen: Nasal Mucosa; Nasal Swab  Result Value Ref Range Status  MRSA by PCR Next Gen NOT DETECTED NOT DETECTED Final    Comment: (NOTE) The GeneXpert MRSA Assay (FDA approved for NASAL specimens only), is one component of a comprehensive MRSA colonization surveillance program. It is not intended to diagnose MRSA infection nor to guide or monitor treatment for MRSA infections. Test performance is not FDA approved in patients less than 21 years old. Performed at [facility], [address], [place], [place] [address]       Studies: No results found.  Scheduled Meds:  colchicine  0.6 mg Oral Daily   enoxaparin (LOVENOX) injection  40 mg Subcutaneous Q24H   feeding supplement  237 mL Oral TID BM   folic acid  1 mg Oral Daily   gabapentin  300 mg Oral TID   Gerhardt's butt cream   Topical QID   multivitamin with minerals  1 tablet Oral Daily   pantoprazole  40 mg Oral Daily   thiamine  100 mg Oral Daily    Continuous Infusions:     LOS: 6 days     Darlin Drop, MD [facility] Pager [phone]  If 7PM-7AM, please contact night-coverage [email or web address] Password [email or web address] 08/06/2021, 5:14 PM

## 2021-08-06 NOTE — Plan of Care (Signed)
  Problem: Coping: Goal: Level of anxiety will decrease Outcome: Progressing   Problem: Nutrition: Goal: Adequate nutrition will be maintained Outcome: Progressing   Problem: Coping: Goal: Level of anxiety will decrease Outcome: Progressing   Problem: Pain Managment: Goal: General experience of comfort will improve Outcome: Progressing   Problem: Safety: Goal: Ability to remain free from injury will improve Outcome: Progressing

## 2021-08-06 NOTE — Progress Notes (Signed)
Physical Therapy Treatment Patient Details Name: William [name] MRN: [id number] DOB: [birth year] Today's Date: 08/06/2021   History of Present Illness 55 yo male presenting generalized weakness and bilateral wrist and leg pain. Diagnosed with gout flare; started on colchicine. PMH including EtOH abuse, cholecystitis, liver failure (2018), and gout.    PT Comments    Mobility progression remains limited by pain in bilat knees, wrist, and fingers. Pt unable to achieve full upright standing or tolerate any time on his feet. Continue to recommend SNF upon d/c to allow for increased time to allow pain to subside and pt to progress functionally. Acute PT to cont to follow.    Recommendations for follow up therapy are one component of a multi-disciplinary discharge planning process, led by the attending physician.  Recommendations may be updated based on patient status, additional functional criteria and insurance authorization.  Follow Up Recommendations  Skilled nursing-short term rehab (<3 hours/day)     Assistance Recommended at Discharge Frequent or constant Supervision/Assistance  Equipment Recommendations  Rolling walker (2 wheels)    Recommendations for Other Services       Precautions / Restrictions Precautions Precautions: Fall Precaution Comments: painful bilat knees, R wrist and hand Restrictions Weight Bearing Restrictions: No     Mobility  Bed Mobility Overal bed mobility: Needs Assistance Bed Mobility: Supine to Sit;Sit to Supine     Supine to sit: HOB elevated;Min assist Sit to supine: Min assist   General bed mobility comments: HOB elevated, pt able to bring LEs out of bed but requiring minA to bring them back up into bed, minA for trunk elevation due to bilat wrist and hand weakness due to pain from gout flair up    Transfers Overall transfer level: Needs assistance Equipment used: 2 person hand held assist Transfers: Sit to/from [employer] Sit to Stand: Mod assist;+2 safety/equipment;From elevated surface;+2 physical assistance;Max assist           General transfer comment: attempted several times to stand however unable to get to full standing and bilat knee extension due to onset of 10/10 bilat knee pain, trialed stedy as well and was unsuccessful    Ambulation/Gait             General Gait Details: unable due to bilat knee pain   Stairs             Wheelchair Mobility    Modified Rankin (Stroke Patients Only)       Balance Overall balance assessment: Needs assistance Sitting-balance support: No upper extremity supported Sitting balance-Leahy Scale: Fair Sitting balance - Comments: weak core, but can sit without UE support on EOB                                    Cognition Arousal/Alertness: Awake/alert Behavior During Therapy: WFL for tasks assessed/performed Overall Cognitive Status: No family/caregiver present to determine baseline cognitive functioning                                 General Comments: pt more agreeable to try therapy than previous sessions, pt able to follow commands, was not impulsive and was aware of deficits today        Exercises      General Comments General comments (skin integrity, edema, etc.): pt with noted swelling in L knee, VSS  Pertinent Vitals/Pain Pain Assessment: Faces Faces Pain Scale: Hurts whole lot Pain Location: Bilat knees with weight bearing Pain Descriptors / Indicators: Aching;Guarding;Grimacing Pain Intervention(s): Limited activity within patient's tolerance    Home Living                          Prior Function            PT Goals (current goals can now be found in the care plan section) Acute Rehab PT Goals PT Goal Formulation: With patient Time For Goal Achievement: 08/14/21 Potential to Achieve Goals: Fair Progress towards PT goals: Progressing toward goals     Frequency    Min 2X/week      PT Plan Current plan remains appropriate    Co-evaluation              AM-PAC PT "6 Clicks" Mobility   Outcome Measure  Help needed turning from your back to your side while in a flat bed without using bedrails?: A Little Help needed moving from lying on your back to sitting on the side of a flat bed without using bedrails?: A Little Help needed moving to and from a bed to a chair (including a wheelchair)?: A Lot Help needed standing up from a chair using your arms (e.g., wheelchair or bedside chair)?: Total Help needed to walk in hospital room?: Total Help needed climbing 3-5 steps with a railing? : Total 6 Click Score: 11    End of Session Equipment Utilized During Treatment: Gait belt Activity Tolerance: Patient limited by pain Patient left: in bed;with call bell/phone within reach;with bed alarm set Nurse Communication: Mobility status PT Visit Diagnosis: Other abnormalities of gait and mobility (R26.89);Difficulty in walking, not elsewhere classified (R26.2);Muscle weakness (generalized) (M62.81) Pain - Right/Left:  (both) Pain - part of body: Hand;Ankle and joints of foot     Time: 9233-0076 PT Time Calculation (min) (ACUTE ONLY): 26 min  Charges:  $Therapeutic Activity: 23-37 mins                     Lewis Shock, PT, DPT Acute Rehabilitation Services Pager #: [phone] Office #: [phone]    William [name] 08/06/2021, 1:23 PM

## 2021-08-07 LAB — GLUCOSE, CAPILLARY: Glucose-Capillary: 99 mg/dL (ref 70–99)

## 2021-08-07 MED ORDER — IPRATROPIUM-ALBUTEROL 0.5-2.5 (3) MG/3ML IN SOLN: 3.0000 mL | Freq: Two times a day (BID) | RESPIRATORY_TRACT | Status: DC

## 2021-08-07 NOTE — Progress Notes (Signed)
PROGRESS NOTE  [name] IWP:[id number] DOB: August 31, 1966 DOA: 07/30/2021 PCP: Patient, No Pcp Per (Inactive)  HPI/Recap of past 24 hours: William [name] is a 55 y.o. male with medical history significant of EtOH abuse, gout.  Recently seen in ED 6 days prior to presentation with complaints of pain in his wrists and legs bilaterally, diagnosed with gout flare, started on colchicine.  He was seen by visiting nurse at home prior to his presentation and was found to be generally weak.  Had not been off the couch for a while, urinating and stooling self.  Reported decubitus ulcer on backside.  Presented with complaints of constant generalized severe pain involving both legs and both wrists.  Also reported abdominal pain for which CT abdomen and pelvis with contrast was obtained by EDP.  It was unrevealing however could not completely rule out community-acquired pneumonia.  Procalcitonin was elevated 0.72, treated empirically with Rocephin and p.o. doxycycline for CAP, POA.  PT assessed and recommended SNF, pending payer source.  Patient has an unsafe home situation.  States he lives with roommates and they are not able to assist him.  Hospital course complicated by bilateral lower extremity pain improved with gabapentin, doses were increased to 300 mg 3 times daily with improvement of his symptomatology.   08/07/2021: Patient was seen and examined at his bedside.  Reports his lower extremity pain is improved with the current dose of gabapentin.  He has no new concerns.  Assessment/Plan: Principal Problem:   SIRS (systemic inflammatory response syndrome) (HCC) Active Problems:   History of gout   Alcoholism (HCC)   Elevated LFTs   Generalized weakness   Pressure injury of skin   Protein-calorie malnutrition, severe   Treated community-acquired pneumonia, poa- Completed 5 days of Rocephin and doxycycline. DuoNebs every 6 hours. Continue to Mobilize as tolerated Continue incentive  spirometer. Gout/polyneuropathy Cont colchicine, continue gabapentin 300 mg 3 times daily. Continue fall precautions. EtOH abuse -   Outside of window for withdrawal.  No evidence of alcohol withdrawal at time of this visit. Continue MVM, Thiamine and folic acid supplements LFT elevation - RUQ [place] and CT AP = no acute findings, gallstones seen on RUQ [place] Acute hepatitis panel negative. Pressure injury -seen by wound care specialist.  Continue local wound care. Dental caries, chronic-will need to follow-up with dental care outpatient. Hyperbilirubinemia, downtrending- T bilirubin 1.3 from 2.0. Mild hypovolemic hyponatremia Serum sodium 130 Continue to encourage oral intake.   DVT prophylaxis: Lovenox SQ daily Code Status: Full Family Communication: No family at bedside. Disposition Plan: Unsafe home situation. Consults called: None Admission status: Inpatient status.  Patient will require at least 2 midnights for further evaluation and treatment of present condition.     Procedures: None    Antimicrobials: Rocephin 07/31/21> 08/04/2021 Po Doxycycline 07/31/21> 08/04/2021.        Objective: Vitals:   08/07/21 0030 08/07/21 0450 08/07/21 0738 08/07/21 0825  BP: 104/74 122/85 104/82   Pulse: 91 87 88 83  Resp: 19 20 12 16   Temp: 97.8 F (36.6 C) 97.7 F (36.5 C) 98.8 F (37.1 C)   TempSrc: Oral Oral Axillary   SpO2: 96% 95% 94% 95%  Weight:      Height:        Intake/Output Summary (Last 24 hours) at 08/07/2021 1110 Last data filed at 08/07/2021 0453 Gross per 24 hour  Intake 60 ml  Output 850 ml  Net -790 ml   Filed Weights   07/30/21  1639 07/31/21 2132  Weight: 81.6 kg 81.5 kg    Exam:  General: 55 y.o. year-old male well-developed well-nourished no acute distress.  He is alert and oriented x3.   Cardiovascular: Regular rate and rhythm no rubs gallops. Respiratory: Clear to auscultation no wheezes or rales. Abdomen: Soft nontender no bowel sounds  present.  Musculoskeletal: Evidence of knee arthritis bilaterally.   Skin: Pressure injuries, POA. Psychiatry: Mood is appropriate for condition and setting.   Data Reviewed: CBC: Recent Labs  Lab 08/01/21 0215  WBC 6.7  HGB 11.5*  HCT 35.8*  MCV 94.5  PLT 320   Basic Metabolic Panel: Recent Labs  Lab 08/01/21 0215  NA 130*  K 3.5  CL 102  CO2 21*  GLUCOSE 101*  BUN 15  CREATININE 0.77  CALCIUM 8.5*  MG 2.4  PHOS 3.7   GFR: Estimated Creatinine Clearance: 111.1 mL/min (by C-G formula based on SCr of 0.77 mg/dL). Liver Function Tests: Recent Labs  Lab 08/01/21 0215  AST 41  ALT 58*  ALKPHOS 250*  BILITOT 1.3*  PROT 5.9*  ALBUMIN 2.2*   No results for input(s): LIPASE, AMYLASE in the last 168 hours.  No results for input(s): AMMONIA in the last 168 hours.  Coagulation Profile: No results for input(s): INR, PROTIME in the last 168 hours.  Cardiac Enzymes: No results for input(s): CKTOTAL, CKMB, CKMBINDEX, TROPONINI in the last 168 hours.  BNP (last 3 results) No results for input(s): PROBNP in the last 8760 hours. HbA1C: No results for input(s): HGBA1C in the last 72 hours. CBG: No results for input(s): GLUCAP in the last 168 hours. Lipid Profile: No results for input(s): CHOL, HDL, LDLCALC, TRIG, CHOLHDL, LDLDIRECT in the last 72 hours. Thyroid Function Tests: No results for input(s): TSH, T4TOTAL, FREET4, T3FREE, THYROIDAB in the last 72 hours. Anemia Panel: No results for input(s): VITAMINB12, FOLATE, FERRITIN, TIBC, IRON, RETICCTPCT in the last 72 hours. Urine analysis:    Component Value Date/Time   COLORURINE AMBER (A) 07/30/2021 1653   APPEARANCEUR CLEAR 07/30/2021 1653   LABSPEC 1.021 07/30/2021 1653   PHURINE 7.0 07/30/2021 1653   GLUCOSEU NEGATIVE 07/30/2021 1653   HGBUR NEGATIVE 07/30/2021 1653   BILIRUBINUR NEGATIVE 07/30/2021 1653   KETONESUR NEGATIVE 07/30/2021 1653   PROTEINUR NEGATIVE 07/30/2021 1653   UROBILINOGEN 0.2  01/26/2015 0903   NITRITE NEGATIVE 07/30/2021 1653   LEUKOCYTESUR NEGATIVE 07/30/2021 1653   Sepsis Labs: @LABRCNTIP (procalcitonin:4,lacticidven:4)  ) Recent Results (from the past 240 hour(s))  Blood Culture (routine x 2)     Status: None   Collection Time: 07/30/21  5:11 PM   Specimen: BLOOD LEFT ARM  Result Value Ref Range Status   Specimen Description BLOOD LEFT ARM  Final   Special Requests   Final    BOTTLES DRAWN AEROBIC AND ANAEROBIC Blood Culture results may not be optimal due to an excessive volume of blood received in culture bottles   Culture   Final    NO GROWTH 5 DAYS Performed at [facility], [address], [place], [place]    Report Status 08/04/2021 FINAL  Final  Urine Culture     Status: Abnormal   Collection Time: 07/30/21  5:50 PM   Specimen: In/Out Cath Urine  Result Value Ref Range Status   Specimen Description IN/OUT CATH URINE  Final   Special Requests   Final    NONE Performed at [facility], [address] [place], [place]  Culture MULTIPLE SPECIES PRESENT, SUGGEST RECOLLECTION (A)  Final   Report Status 07/31/2021 FINAL  Final  Resp Panel by RT-PCR (Flu A&B, Covid) In/Out Cath Urine     Status: None   Collection Time: 07/30/21  5:50 PM   Specimen: In/Out Cath Urine; Nasopharyngeal(NP) swabs in vial transport medium  Result Value Ref Range Status   SARS Coronavirus 2 by RT PCR NEGATIVE NEGATIVE Final    Comment: (NOTE) SARS-CoV-2 target nucleic acids are NOT DETECTED.  The SARS-CoV-2 RNA is generally detectable in upper respiratory specimens during the acute phase of infection. The lowest concentration of SARS-CoV-2 viral copies this assay can detect is 138 copies/mL. A negative result does not preclude SARS-Cov-2 infection and should not be used as the sole basis for treatment or other patient management decisions. A negative result may occur with  improper specimen collection/handling, submission of  specimen other than nasopharyngeal swab, presence of viral mutation(s) within the areas targeted by this assay, and inadequate number of viral copies(<138 copies/mL). A negative result must be combined with clinical observations, patient history, and epidemiological information. The expected result is Negative.  Fact Sheet for Patients:  BloggerCourse.com  Fact Sheet for Healthcare Providers:  SeriousBroker.it  This test is no t yet approved or cleared by the [place] FDA and  has been authorized for detection and/or diagnosis of SARS-CoV-2 by FDA under an Emergency Use Authorization (EUA). This EUA will remain  in effect (meaning this test can be used) for the duration of the COVID-19 declaration under Section 564(b)(1) of the Act, 21 U.S.C.section 360bbb-3(b)(1), unless the authorization is terminated  or revoked sooner.       Influenza A by PCR NEGATIVE NEGATIVE Final   Influenza B by PCR NEGATIVE NEGATIVE Final    Comment: (NOTE) The Xpert Xpress SARS-CoV-2/FLU/RSV plus assay is intended as an aid in the diagnosis of influenza from Nasopharyngeal swab specimens and should not be used as a sole basis for treatment. Nasal washings and aspirates are unacceptable for Xpert Xpress SARS-CoV-2/FLU/RSV testing.  Fact Sheet for Patients: BloggerCourse.com  Fact Sheet for Healthcare Providers: SeriousBroker.it  This test is not yet approved or cleared by the [place] FDA and has been authorized for detection and/or diagnosis of SARS-CoV-2 by FDA under an Emergency Use Authorization (EUA). This EUA will remain in effect (meaning this test can be used) for the duration of the COVID-19 declaration under Section 564(b)(1) of the Act, 21 U.S.C. section 360bbb-3(b)(1), unless the authorization is terminated or revoked.  Performed at [facility], [address],  [place], [place] [address]   Blood Culture (routine x 2)     Status: None   Collection Time: 07/30/21 10:31 PM   Specimen: BLOOD  Result Value Ref Range Status   Specimen Description BLOOD BLOOD RIGHT HAND  Final   Special Requests   Final    BOTTLES DRAWN AEROBIC AND ANAEROBIC Blood Culture adequate volume   Culture   Final    NO GROWTH 5 DAYS Performed at [facility], [address] [place], [place] [address]    Report Status 08/04/2021 FINAL  Final  MRSA Next Gen by PCR, Nasal     Status: None   Collection Time: 07/31/21  1:01 PM   Specimen: Nasal Mucosa; Nasal Swab  Result Value Ref Range Status   MRSA by PCR Next Gen NOT DETECTED NOT DETECTED Final    Comment: (NOTE) The GeneXpert MRSA Assay (FDA approved for NASAL specimens only),  is one component of a comprehensive MRSA colonization surveillance program. It is not intended to diagnose MRSA infection nor to guide or monitor treatment for MRSA infections. Test performance is not FDA approved in patients less than 68 years old. Performed at [facility], [address] [place], [place] [address]       Studies: No results found.  Scheduled Meds:  colchicine  0.6 mg Oral Daily   enoxaparin (LOVENOX) injection  40 mg Subcutaneous Q24H   feeding supplement  237 mL Oral TID BM   folic acid  1 mg Oral Daily   gabapentin  300 mg Oral TID   Gerhardt's butt cream   Topical QID   multivitamin with minerals  1 tablet Oral Daily   pantoprazole  40 mg Oral Daily   thiamine  100 mg Oral Daily    Continuous Infusions:     LOS: 7 days     Darlin Drop, MD [facility] Pager [phone]  If 7PM-7AM, please contact night-coverage [email or web address] Password [email or web address] 08/07/2021, 11:10 AM

## 2021-08-07 NOTE — Progress Notes (Signed)
Nutrition Follow-up  DOCUMENTATION CODES:   Severe malnutrition in context of chronic illness  INTERVENTION:   Continue Ensure Enlive po TID, each supplement provides 350 kcal and 20 grams of protein  Continue Magic cup TID with meals, each supplement provides 290 kcal and 9 grams of protein  Continue multivitamin w/ minerals daily  Change pt to regular diet, with chopped meats. Encourage good PO intake  NUTRITION DIAGNOSIS:   Severe Malnutrition related to chronic illness (ETOH abuse, gout) as evidenced by severe fat depletion, severe muscle depletion. - Ongoing   GOAL:   Patient will meet greater than or equal to 90% of their needs - Ongoing  MONITOR:   PO intake, Supplement acceptance, Weight trends, Skin, I & O's  REASON FOR ASSESSMENT:   Consult Assessment of nutrition requirement/status, Poor PO  ASSESSMENT:   William [name] is a 55 y.o. male with medical history significant of EtOH abuse, gout.  Recently seen in ED 6 days ago with pain in his wrists and legs bilaterally, diagnosed with gout flare, started on colchicine.  He was seen by visiting nurse at home prior to his presentation and was found to be generally weak.  Had not been off the couch for a while, urinating and stooling self.  Reported decubitus ulcer on backside.  Complaining of generalized pain involving both legs both wrists.  Symptoms are constant, severe, and worsening.  Reviewed imagings, including CT scan, could not completely rule out community-acquired pneumonia.  Procalcitonin elevated 0.72, treated empirically with Rocephin and p.o. doxycycline.  10/17: Heart Healthy Diet 10/20: Dys 3 Diet  Pt laying in bed, lethargic. PLDN unable to obtain all information.  Pt reports that he has been sick this morning and could not eat. PLDN observed breakfast tray in room, remained untouched. Pt stated that he will eat lunch when is arrives.  Per EMR, pt intake includes 10/20: Lunch 85% 10/21: Breakfast  75% 10/22: Breakfast 10%, Lunch 20% 10/23: Breakfast 0% 10/24: Breakfast 0%, Lunch 50%  Per RN, pt has had poor PO yesterday and today, but did drink and Ensure and state that he liked them.   Previous RD changed diet to D3 to help pt with chewing foods due to poor dentition. Pt intake has decreased since transition to D3 diet. Change pt to regular with chopped meats and see is pt intake improves.   Medications reviewed and include: Folic Acid, MVI, Protonix, Thiamine Labs reviewed: Sodium 130  Diet Order:   Diet Order             DIET DYS 3 Room service appropriate? Yes; Fluid consistency: Thin  Diet effective now                   EDUCATION NEEDS:   No education needs have been identified at this time  Skin:  Skin Assessment: Skin Integrity Issues: Skin Integrity Issues:: Stage II Stage II: buttocks  Last BM:  08/04/2021  Height:   Ht Readings from Last 1 Encounters:  07/31/21 5\' 11"  (1.803 m)    Weight:   Wt Readings from Last 1 Encounters:  07/31/21 81.5 kg    Ideal Body Weight:  78.2 kg  BMI:  Body mass index is 25.06 kg/m.  Estimated Nutritional Needs:   Kcal:  08/02/21  Protein:  140-155 grams  Fluid:  > 2 L    Amos Gaber BS, PLDN Clinical Dietitian See AMiON for contact information.

## 2021-08-07 NOTE — Progress Notes (Signed)
Occupational Therapy Treatment Patient Details Name: William [name] MRN: [id number] DOB: Jun 04, 1966 Today's Date: 08/07/2021   History of present illness 55 yo male presenting generalized weakness and bilateral wrist and leg pain. Diagnosed with gout flare; started on colchicine. PMH including EtOH abuse, cholecystitis, liver failure (2018), and gout.   OT comments  Patient received in bed and agreeable to OT services.  Patient was able to get to eob with mod assist due to resistance towards using RUE.  Once on eob patient stated he needed a bed pan. Patient was encouraged to use [facility] but stated he didn't have time.  Patient was max assist for toileting at bed level. Patient returned to sitting on EOB and performed 2 stands with mod to max assist to stand and for balance.  Patient was able to step towards head of bed.  Acute OT to continue to follow.    Recommendations for follow up therapy are one component of a multi-disciplinary discharge planning process, led by the attending physician.  Recommendations may be updated based on patient status, additional functional criteria and insurance authorization.    Follow Up Recommendations  Skilled nursing-short term rehab (<3 hours/day)    Assistance Recommended at Discharge Frequent or constant Supervision/Assistance  Equipment Recommendations  Other (comment)    Recommendations for Other Services      Precautions / Restrictions Precautions Precautions: Fall Precaution Comments: painful bilat knees, R wrist and hand Restrictions Weight Bearing Restrictions: No RUE Weight Bearing: Weight bearing as tolerated       Mobility Bed Mobility Overal bed mobility: Needs Assistance Bed Mobility: Supine to Sit;Sit to Supine     Supine to sit: HOB elevated;Mod assist Sit to supine: Min assist   General bed mobility comments: difficulty with trunk due to pain at RUE and patient avoiding RUE use    Transfers Overall transfer level: Needs  assistance                 General transfer comment: performed 2 stands from eob with mod to max assist     Balance Overall balance assessment: Needs assistance Sitting-balance support: No upper extremity supported Sitting balance-Leahy Scale: Fair Sitting balance - Comments: able to maintain balance while on eob   Standing balance support: Bilateral upper extremity supported Standing balance-Leahy Scale: Poor Standing balance comment: UE support and physical assist for balance                           ADL either performed or assessed with clinical judgement   ADL Overall ADL's : Needs assistance/impaired                             Toileting- Clothing Manipulation and Hygiene: Maximal assistance;Bed level Toileting - Clothing Manipulation Details (indicate cue type and reason): toiletint performed at bed level due to patient did not want to transfer to [facility]       General ADL Comments: patient went from eob from supine and back to supine for bed pan     Vision       Perception     Praxis      Cognition Arousal/Alertness: Awake/alert Behavior During Therapy: WFL for tasks assessed/performed Overall Cognitive Status: No family/caregiver present to determine baseline cognitive functioning  General Comments: patient attempted standing but did not want to address transfers          Exercises     Shoulder Instructions       General Comments      Pertinent Vitals/ Pain       Pain Assessment: Faces Faces Pain Scale: Hurts whole lot Pain Location: BLEs and RUE Pain Descriptors / Indicators: Aching;Guarding;Grimacing Pain Intervention(s): Limited activity within patient's tolerance;Monitored during session  Home Living                                          Prior Functioning/Environment              Frequency  Min 2X/week        Progress Toward Goals  OT  Goals(current goals can now be found in the care plan section)  Progress towards OT goals: Progressing toward goals  Acute Rehab OT Goals OT Goal Formulation: With patient Time For Goal Achievement: 08/14/21 Potential to Achieve Goals: Good ADL Goals Pt Will Perform Upper Body Dressing: with supervision;sitting Pt Will Transfer to Toilet: with min assist;stand pivot transfer;bedside commode Additional ADL Goal #1: Pt will perform bed mobility with Min A in preparation for ADLs  Plan Discharge plan remains appropriate    Co-evaluation                 AM-PAC OT "6 Clicks" Daily Activity     Outcome Measure   Help from another person eating meals?: A Little Help from another person taking care of personal grooming?: A Little Help from another person toileting, which includes using toliet, bedpan, or urinal?: A Lot Help from another person bathing (including washing, rinsing, drying)?: A Lot Help from another person to put on and taking off regular upper body clothing?: A Little Help from another person to put on and taking off regular lower body clothing?: A Lot 6 Click Score: 15    End of Session Equipment Utilized During Treatment: Gait belt;Rolling walker (2 wheels)  OT Visit Diagnosis: Unsteadiness on feet (R26.81);Other abnormalities of gait and mobility (R26.89);Muscle weakness (generalized) (M62.81);Pain Pain - Right/Left: Right Pain - part of body: Hand;Knee;Leg   Activity Tolerance Patient limited by pain   Patient Left in bed;with call bell/phone within reach;with bed alarm set   Nurse Communication Other (comment) (on patient's level of participation)        Time: 2633-3545 OT Time Calculation (min): 26 min  Charges: OT General Charges $OT Visit: 1 Visit OT Treatments $Self Care/Home Management : 8-22 mins $Therapeutic Activity: 8-22 mins  Alfonse Flavors, OTA Acute Rehabilitation Services  Pager [phone] Office [phone]   Dewain Penning 08/07/2021, 2:38 PM

## 2021-08-08 MED ORDER — PANTOPRAZOLE SODIUM 40 MG PO TBEC
40.0000 mg | DELAYED_RELEASE_TABLET | Freq: Two times a day (BID) | ORAL | Status: DC
  Administered 2021-08-08 – 2021-08-13 (×10): 40 mg via ORAL
  Filled 2021-08-08 (×10): qty 1

## 2021-08-08 MED ORDER — NAPROXEN 250 MG PO TABS
250.0000 mg | ORAL_TABLET | Freq: Two times a day (BID) | ORAL | Status: DC
  Administered 2021-08-08 – 2021-08-13 (×10): 250 mg via ORAL
  Filled 2021-08-08 (×11): qty 1

## 2021-08-08 MED ORDER — GABAPENTIN 400 MG PO CAPS
400.0000 mg | ORAL_CAPSULE | Freq: Three times a day (TID) | ORAL | Status: DC
  Administered 2021-08-08 – 2021-08-13 (×15): 400 mg via ORAL
  Filled 2021-08-08 (×15): qty 1

## 2021-08-08 NOTE — TOC Progression Note (Signed)
Transition of Care [facility]) - Progression Note    Patient Details  Name: William [name] MRN: [id number] Date of Birth: 1966/08/14  Transition of Care [facility]) CM/SW Contact  Mearl Latin, LCSW Phone Number: 08/08/2021, 9:49 AM  Clinical Narrative:    CSW emailed financial counseling again and asked [employer] if they would be willing to consider an LOG for patient.    Expected Discharge Plan: Skilled Nursing Facility Barriers to Discharge: Continued Medical Work up, SNF Pending payor source - LOG, SNF Pending bed offer, Unsafe home situation  Expected Discharge Plan and Services Expected Discharge Plan: Skilled Nursing Facility In-house Referral: Clinical Social Work     Living arrangements for the past 2 months: Single Family Home                                       Social Determinants of Health (SDOH) Interventions    Readmission Risk Interventions No flowsheet data found.

## 2021-08-08 NOTE — Progress Notes (Signed)
Patient's oxygen saturation intermittently dropped to the 80s.  Attempted to place oxygen via N/C and patient refused.

## 2021-08-08 NOTE — Progress Notes (Signed)
PROGRESS NOTE        PATIENT DETAILS Name: William [name] Age: 55 y.o. Sex: male Date of Birth: [birth year] Admit Date: 07/30/2021 Admitting Physician Kayleen Memos, DO YDX:AJOINOM, No Pcp Per (Inactive)  Brief Narrative: Patient is a 55 y.o. male with history of EtOH use, gout, peripheral neuropathy-who presented with generalized arthralgias/myalgias along with generalized weakness.  He was subsequently admitted to the hospitalist service-see below for further details.  Subjective: Lying comfortably in bed-denies any chest pain or shortness of breath.  Objective: Vitals: Blood pressure 111/66, pulse 82, temperature 98.3 F (36.8 C), temperature source Axillary, resp. rate (!) 22, height 5' 11"  (1.803 m), weight 81.5 kg, SpO2 92 %.   Exam: Gen Exam:Alert awake-not in any distress HEENT:atraumatic, normocephalic Chest: B/L clear to auscultation anteriorly CVS:S1S2 regular Abdomen:soft non tender, non distended Extremities:no edema Neurology: Non focal Skin: no rash  Pertinent Labs/Radiology: 10/17>> uric acid: 6.4 10/17>>CXR: No pneumonia 10/17>> CT abdomen: No acute findings  Assessment/Plan: Community-acquired pneumonia: Completed treatment with IV antibiotics-remains stable-on room air.  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  Polyarthralgias: Unclear if this is related to gout-none of his joints appear inflamed (swollen/erythematous/tender) to me-will try scheduled naproxen for a few days.  Check CRP/ESR with a.m. labs.  History of gout: Continue colchicine.  Transaminitis: Unclear etiology-could have been related to prior alcohol use-LFTs downtrending.  CT abdomen without any significant abnormalities.  EtOH use: Out of window for withdrawal symptoms.  Right adrenal gland nodule: Seen incidentally on CT abdomen-radiology recommends  noncontrast CT abdomen in 6 months to assess stability  Deconditioning/debility/polyneuropathy: Awaiting SNF placement.  Nutrition Status: Nutrition Problem: Severe Malnutrition Etiology: chronic illness (ETOH abuse, gout) Signs/Symptoms: severe fat depletion, severe muscle depletion Interventions: MVI, Ensure Enlive (each supplement provides 350kcal and 20 grams of protein), Magic cup   BMI Estimated body mass index is 25.06 kg/m as calculated from the following:   Height as of this encounter: 5' 11"  (1.803 m).   Weight as of this encounter: 81.5 kg.   Pressure injury: Pressure Injury 07/30/21 Buttocks Mid;Bilateral Stage 2 -  Partial thickness loss of dermis presenting as a shallow open injury with a red, pink wound bed without slough. stage 2 pressure injury with surrounding deep tissue injury (Active)  07/30/21 2252  Location: Buttocks  Location Orientation: Mid;Bilateral  Staging: Stage 2 -  Partial thickness loss of dermis presenting as a shallow open injury with a red, pink wound bed without slough.  Wound Description (Comments): stage 2 pressure injury with surrounding deep tissue injury  Present on Admission: Yes     Procedures: None Consults: None DVT Prophylaxis: Lovenox Code Status:Full code  Family Communication: None at bedside  Time spent: 25 minutes-Greater than 50% of this time was spent in counseling, explanation of diagnosis, planning of further management, and coordination of care.  Diet: Diet Order             Diet regular Room service appropriate? Yes; Fluid consistency: Thin  Diet effective now                      Disposition Plan: Status is: Inpatient  Remains inpatient appropriate because: Inpatient level of treatment   Barriers to Discharge: Awaiting SNF  Antimicrobial agents: Anti-infectives (From admission, onward)  Start     Dose/Rate Route Frequency Ordered Stop   07/31/21 1330  cefTRIAXone (ROCEPHIN) 1 g in sodium  chloride 0.9 % 100 mL IVPB  Status:  Discontinued        1 g 200 mL/hr over 30 Minutes Intravenous Every 24 hours 07/31/21 1306 08/05/21 1249   07/31/21 1315  doxycycline (VIBRA-TABS) tablet 100 mg        100 mg Oral Every 12 hours 07/31/21 1306 08/04/21 2137        MEDICATIONS: Scheduled Meds:  colchicine  0.6 mg Oral Daily   enoxaparin (LOVENOX) injection  40 mg Subcutaneous Q24H   feeding supplement  237 mL Oral TID BM   folic acid  1 mg Oral Daily   gabapentin  400 mg Oral TID   Gerhardt's butt cream   Topical QID   multivitamin with minerals  1 tablet Oral Daily   pantoprazole  40 mg Oral Daily   thiamine  100 mg Oral Daily   Continuous Infusions: PRN Meds:.acetaminophen **OR** acetaminophen, ipratropium-albuterol, ondansetron **OR** ondansetron (ZOFRAN) IV   I have personally reviewed following labs and imaging studies  LABORATORY DATA: CBC: No results for input(s): WBC, NEUTROABS, HGB, HCT, MCV, PLT in the last 168 hours.  Basic Metabolic Panel: No results for input(s): NA, K, CL, CO2, GLUCOSE, BUN, CREATININE, CALCIUM, MG, PHOS in the last 168 hours.  GFR: Estimated Creatinine Clearance: 111.1 mL/min (by C-G formula based on SCr of 0.77 mg/dL).  Liver Function Tests: No results for input(s): AST, ALT, ALKPHOS, BILITOT, PROT, ALBUMIN in the last 168 hours. No results for input(s): LIPASE, AMYLASE in the last 168 hours. No results for input(s): AMMONIA in the last 168 hours.  Coagulation Profile: No results for input(s): INR, PROTIME in the last 168 hours.  Cardiac Enzymes: No results for input(s): CKTOTAL, CKMB, CKMBINDEX, TROPONINI in the last 168 hours.  BNP (last 3 results) No results for input(s): PROBNP in the last 8760 hours.  Lipid Profile: No results for input(s): CHOL, HDL, LDLCALC, TRIG, CHOLHDL, LDLDIRECT in the last 72 hours.  Thyroid Function Tests: No results for input(s): TSH, T4TOTAL, FREET4, T3FREE, THYROIDAB in the last 72  hours.  Anemia Panel: No results for input(s): VITAMINB12, FOLATE, FERRITIN, TIBC, IRON, RETICCTPCT in the last 72 hours.  Urine analysis:    Component Value Date/Time   COLORURINE AMBER (A) 07/30/2021 1653   APPEARANCEUR CLEAR 07/30/2021 1653   LABSPEC 1.021 07/30/2021 1653   PHURINE 7.0 07/30/2021 1653   GLUCOSEU NEGATIVE 07/30/2021 1653   HGBUR NEGATIVE 07/30/2021 1653   BILIRUBINUR NEGATIVE 07/30/2021 1653   KETONESUR NEGATIVE 07/30/2021 1653   PROTEINUR NEGATIVE 07/30/2021 1653   UROBILINOGEN 0.2 01/26/2015 0903   NITRITE NEGATIVE 07/30/2021 1653   LEUKOCYTESUR NEGATIVE 07/30/2021 1653    Sepsis Labs: Lactic Acid, Venous    Component Value Date/Time   LATICACIDVEN 1.6 07/30/2021 1711    MICROBIOLOGY: Recent Results (from the past 240 hour(s))  Blood Culture (routine x 2)     Status: None   Collection Time: 07/30/21  5:11 PM   Specimen: BLOOD LEFT ARM  Result Value Ref Range Status   Specimen Description BLOOD LEFT ARM  Final   Special Requests   Final    BOTTLES DRAWN AEROBIC AND ANAEROBIC Blood Culture results may not be optimal due to an excessive volume of blood received in culture bottles   Culture   Final    NO GROWTH 5 DAYS Performed at [facility], [address] [place], [place] [address]    Report Status 08/04/2021 FINAL  Final  Urine Culture     Status: Abnormal   Collection Time: 07/30/21  5:50 PM   Specimen: In/Out Cath Urine  Result Value Ref Range Status   Specimen Description IN/OUT CATH URINE  Final   Special Requests   Final    NONE Performed at [facility], [place] [address], [place], [place] [address]    Culture MULTIPLE SPECIES PRESENT, SUGGEST RECOLLECTION (A)  Final   Report Status 07/31/2021 FINAL  Final  Resp Panel by RT-PCR (Flu A&B, Covid) In/Out Cath Urine     Status: None   Collection Time: 07/30/21  5:50 PM   Specimen: In/Out Cath Urine; Nasopharyngeal(NP) swabs in vial transport medium  Result Value Ref Range  Status   SARS Coronavirus 2 by RT PCR NEGATIVE NEGATIVE Final    Comment: (NOTE) SARS-CoV-2 target nucleic acids are NOT DETECTED.  The SARS-CoV-2 RNA is generally detectable in upper respiratory specimens during the acute phase of infection. The lowest concentration of SARS-CoV-2 viral copies this assay can detect is 138 copies/mL. A negative result does not preclude SARS-Cov-2 infection and should not be used as the sole basis for treatment or other patient management decisions. A negative result may occur with  improper specimen collection/handling, submission of specimen other than nasopharyngeal swab, presence of viral mutation(s) within the areas targeted by this assay, and inadequate number of viral copies(<138 copies/mL). A negative result must be combined with clinical observations, patient history, and epidemiological information. The expected result is Negative.  Fact Sheet for Patients:  EntrepreneurPulse.com.au  Fact Sheet for Healthcare Providers:  IncredibleEmployment.be  This test is no t yet approved or cleared by the [place] FDA and  has been authorized for detection and/or diagnosis of SARS-CoV-2 by FDA under an Emergency Use Authorization (EUA). This EUA will remain  in effect (meaning this test can be used) for the duration of the COVID-19 declaration under Section 564(b)(1) of the Act, 21 U.S.C.section 360bbb-3(b)(1), unless the authorization is terminated  or revoked sooner.       Influenza A by PCR NEGATIVE NEGATIVE Final   Influenza B by PCR NEGATIVE NEGATIVE Final    Comment: (NOTE) The Xpert Xpress SARS-CoV-2/FLU/RSV plus assay is intended as an aid in the diagnosis of influenza from Nasopharyngeal swab specimens and should not be used as a sole basis for treatment. Nasal washings and aspirates are unacceptable for Xpert Xpress SARS-CoV-2/FLU/RSV testing.  Fact Sheet for  Patients: EntrepreneurPulse.com.au  Fact Sheet for Healthcare Providers: IncredibleEmployment.be  This test is not yet approved or cleared by the [place] FDA and has been authorized for detection and/or diagnosis of SARS-CoV-2 by FDA under an Emergency Use Authorization (EUA). This EUA will remain in effect (meaning this test can be used) for the duration of the COVID-19 declaration under Section 564(b)(1) of the Act, 21 U.S.C. section 360bbb-3(b)(1), unless the authorization is terminated or revoked.  Performed at [facility], [place] [address], [place], [place] [address]   Blood Culture (routine x 2)     Status: None   Collection Time: 07/30/21 10:31 PM   Specimen: BLOOD  Result Value Ref Range Status   Specimen Description BLOOD BLOOD RIGHT HAND  Final   Special Requests   Final    BOTTLES DRAWN AEROBIC AND ANAEROBIC Blood Culture adequate volume   Culture   Final    NO GROWTH 5 DAYS Performed at [facility], [place] [address] [place], [place] [address]    Report Status 08/04/2021 FINAL  Final  MRSA Next Gen by PCR, Nasal     Status: None   Collection Time: 07/31/21  1:01 PM   Specimen: Nasal Mucosa; Nasal Swab  Result Value Ref Range Status   MRSA by PCR Next Gen NOT DETECTED NOT DETECTED Final    Comment: (NOTE) The GeneXpert MRSA Assay (FDA approved for NASAL specimens only), is one component of a comprehensive MRSA colonization surveillance program. It is not intended to diagnose MRSA infection nor to guide or monitor treatment for MRSA infections. Test performance is not FDA approved in patients less than 36 years old. Performed at [facility], [place] [address], [place], [place] [address]     RADIOLOGY STUDIES/RESULTS: No results found.   LOS: 8 days   Oren Binet, MD  [facility]    To contact the attending provider between 7A-7P or the covering provider during after hours 7P-7A, please  log into the web site [email or web address] and access using universal [place] password for that web site. If you do not have the password, please call the hospital operator.  08/08/2021, 2:31 PM

## 2021-08-09 ENCOUNTER — Inpatient Hospital Stay (HOSPITAL_COMMUNITY): Payer: Medicaid Other

## 2021-08-09 LAB — CBC
HCT: 44.1 % (ref 39.0–52.0)
Hemoglobin: 14.2 g/dL (ref 13.0–17.0)
MCH: 30.1 pg (ref 26.0–34.0)
MCHC: 32.2 g/dL (ref 30.0–36.0)
MCV: 93.4 fL (ref 80.0–100.0)
Platelets: 456 10*3/uL — ABNORMAL HIGH (ref 150–400)
RBC: 4.72 MIL/uL (ref 4.22–5.81)
RDW: 13.2 % (ref 11.5–15.5)
WBC: 7.9 10*3/uL (ref 4.0–10.5)
nRBC: 0 % (ref 0.0–0.2)

## 2021-08-09 LAB — COMPREHENSIVE METABOLIC PANEL
ALT: 77 U/L — ABNORMAL HIGH (ref 0–44)
AST: 51 U/L — ABNORMAL HIGH (ref 15–41)
Albumin: 2.9 g/dL — ABNORMAL LOW (ref 3.5–5.0)
Alkaline Phosphatase: 339 U/L — ABNORMAL HIGH (ref 38–126)
Anion gap: 9 (ref 5–15)
BUN: 20 mg/dL (ref 6–20)
CO2: 26 mmol/L (ref 22–32)
Calcium: 9.9 mg/dL (ref 8.9–10.3)
Chloride: 102 mmol/L (ref 98–111)
Creatinine, Ser: 0.84 mg/dL (ref 0.61–1.24)
GFR, Estimated: >60 mL/min (ref >60)
Glucose, Bld: 109 mg/dL — ABNORMAL HIGH (ref 70–99)
Potassium: 4 mmol/L (ref 3.5–5.1)
Sodium: 137 mmol/L (ref 135–145)
Total Bilirubin: 1.3 mg/dL — ABNORMAL HIGH (ref 0.3–1.2)
Total Protein: 7.1 g/dL (ref 6.5–8.1)

## 2021-08-09 LAB — C-REACTIVE PROTEIN: CRP: 4.1 mg/dL — ABNORMAL HIGH (ref <1.0)

## 2021-08-09 LAB — VITAMIN B12: Vitamin B-12: 186 pg/mL (ref 180–914)

## 2021-08-09 LAB — SEDIMENTATION RATE: Sed Rate: 60 mm/hr — ABNORMAL HIGH (ref 0–16)

## 2021-08-09 LAB — TSH: TSH: 3.399 u[IU]/mL (ref 0.350–4.500)

## 2021-08-09 MED ORDER — CYANOCOBALAMIN 1000 MCG/ML IJ SOLN
1000.0000 ug | Freq: Every day | INTRAMUSCULAR | Status: DC
  Administered 2021-08-09 – 2021-08-13 (×5): 1000 ug via INTRAMUSCULAR
  Filled 2021-08-09 (×7): qty 1

## 2021-08-09 NOTE — Progress Notes (Signed)
PROGRESS NOTE        PATIENT DETAILS Name: William [name] Age: 55 y.o. Sex: male Date of Birth: [birth year] Admit Date: 07/30/2021 Admitting Physician Kayleen Memos, DO JSE:GBTDVVO, No Pcp Per (Inactive)  Brief Narrative: Patient is a 55 y.o. male with history of EtOH use, gout, peripheral neuropathy-who presented with generalized arthralgias/myalgias along with generalized weakness.  He was subsequently admitted to the hospitalist service-see below for further details.  Subjective: Lying comfortably in bed-thinks arthralgias are better.  Inquiring about potential discharge to home if he is able to ambulate.  Objective: Vitals: Blood pressure 90/61, pulse 96, temperature 97.6 F (36.4 C), temperature source Oral, resp. rate 18, height _0  (1.803 m), weight 81.5 kg, SpO2 94 %.   Exam: Gen Exam:Alert awake-not in any distress HEENT:atraumatic, normocephalic Chest: B/L clear to auscultation anteriorly CVS:S1S2 regular Abdomen:soft non tender, non distended Extremities:no edema Neurology: Non focal Skin: no rash   Pertinent Labs/Radiology: 10/17>> uric acid: 6.4 10/27>> TSH: 3.399 10/27>> ESR: 60 10/27>> CRP: 4.1 10/27>> vitamin B12: 186   10/17>>CXR: No pneumonia 10/17>> CT abdomen: No acute findings  Assessment/Plan: Community-acquired pneumonia: Completed treatment with IV antibiotics-remains stable-on room air.  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  Continue to work with PT/OT-if no improvement-we will need outpatient neurology evaluation for EMG etc.    Polyarthralgias: Unclear if this is related to gout-none of his joints appear inflamed (not swollen/erythematous/tender) to me-seems to be improving with scheduled NSAID treatment.  ESR/CRP elevated-May need outpatient evaluation by rheumatology.  Will obtain radiographs of his knee/ankles.  History of  gout: Continue colchicine.  Transaminitis: Unclear etiology-could have been related to prior alcohol use-LFTs downtrending.  CT abdomen without any significant abnormalities.  EtOH use: Out of window for withdrawal symptoms.  Right adrenal gland nodule: Seen incidentally on CT abdomen-radiology recommends noncontrast CT abdomen in 6 months to assess stability  Deconditioning/debility/polyneuropathy: Awaiting SNF placement.  But per patient-since his arthralgias are improving-he thinks he may be able to ambulate a bit more-and is considering discharge home if that is possible.  Continue to work with PT/OT.  Nutrition Status: Nutrition Problem: Severe Malnutrition Etiology: chronic illness (ETOH abuse, gout) Signs/Symptoms: severe fat depletion, severe muscle depletion Interventions: MVI, Ensure Enlive (each supplement provides 350kcal and 20 grams of protein), Magic cup   BMI Estimated body mass index is 25.06 kg/m as calculated from the following:   Height as of this encounter: _1  (1.803 m).   Weight as of this encounter: 81.5 kg.   Pressure injury: Pressure Injury 07/30/21 Buttocks Mid;Bilateral Stage 2 -  Partial thickness loss of dermis presenting as a shallow open injury with a red, pink wound bed without slough. stage 2 pressure injury with surrounding deep tissue injury (Active)  07/30/21 2252  Location: Buttocks  Location Orientation: Mid;Bilateral  Staging: Stage 2 -  Partial thickness loss of dermis presenting as a shallow open injury with a red, pink wound bed without slough.  Wound Description (Comments): stage 2 pressure injury with surrounding deep tissue injury  Present on Admission: Yes     Procedures: None Consults: None DVT Prophylaxis: Lovenox Code Status:Full code  Family Communication: None at bedside  Time spent: 25 minutes-Greater than 50% of this time was spent in counseling, explanation of diagnosis, planning of further management, and coordination  of  care.  Diet: Diet Order             Diet regular Room service appropriate? Yes; Fluid consistency: Thin  Diet effective now                      Disposition Plan: Status is: Inpatient  Remains inpatient appropriate because: Inpatient level of treatment   Barriers to Discharge: Awaiting SNF  Antimicrobial agents: Anti-infectives (From admission, onward)    Start     Dose/Rate Route Frequency Ordered Stop   07/31/21 1330  cefTRIAXone (ROCEPHIN) 1 g in sodium chloride 0.9 % 100 mL IVPB  Status:  Discontinued        1 g 200 mL/hr over 30 Minutes Intravenous Every 24 hours 07/31/21 1306 08/05/21 1249   07/31/21 1315  doxycycline (VIBRA-TABS) tablet 100 mg        100 mg Oral Every 12 hours 07/31/21 1306 08/04/21 2137        MEDICATIONS: Scheduled Meds:  colchicine  0.6 mg Oral Daily   enoxaparin (LOVENOX) injection  40 mg Subcutaneous Q24H   feeding supplement  237 mL Oral TID BM   folic acid  1 mg Oral Daily   gabapentin  400 mg Oral TID   Gerhardt's butt cream   Topical QID   multivitamin with minerals  1 tablet Oral Daily   naproxen  250 mg Oral BID WC   pantoprazole  40 mg Oral BID   thiamine  100 mg Oral Daily   Continuous Infusions: PRN Meds:.acetaminophen **OR** acetaminophen, ipratropium-albuterol, ondansetron **OR** ondansetron (ZOFRAN) IV   I have personally reviewed following labs and imaging studies  LABORATORY DATA: CBC: Recent Labs  Lab 08/09/21 0136  WBC 7.9  HGB 14.2  HCT 44.1  MCV 93.4  PLT 456*    Basic Metabolic Panel: Recent Labs  Lab 08/09/21 0136  NA 137  K 4.0  CL 102  CO2 26  GLUCOSE 109*  BUN 20  CREATININE 0.84  CALCIUM 9.9    GFR: Estimated Creatinine Clearance: 105.8 mL/min (by C-G formula based on SCr of 0.84 mg/dL).  Liver Function Tests: Recent Labs  Lab 08/09/21 0136  AST 51*  ALT 77*  ALKPHOS 339*  BILITOT 1.3*  PROT 7.1  ALBUMIN 2.9*   No results for input(s): LIPASE, AMYLASE in the last  168 hours. No results for input(s): AMMONIA in the last 168 hours.  Coagulation Profile: No results for input(s): INR, PROTIME in the last 168 hours.  Cardiac Enzymes: No results for input(s): CKTOTAL, CKMB, CKMBINDEX, TROPONINI in the last 168 hours.  BNP (last 3 results) No results for input(s): PROBNP in the last 8760 hours.  Lipid Profile: No results for input(s): CHOL, HDL, LDLCALC, TRIG, CHOLHDL, LDLDIRECT in the last 72 hours.  Thyroid Function Tests: Recent Labs    08/09/21 0136  TSH 3.399    Anemia Panel: Recent Labs    08/09/21 0136  VITAMINB12 186    Urine analysis:    Component Value Date/Time   COLORURINE AMBER (A) 07/30/2021 1653   APPEARANCEUR CLEAR 07/30/2021 1653   LABSPEC 1.021 07/30/2021 1653   PHURINE 7.0 07/30/2021 1653   GLUCOSEU NEGATIVE 07/30/2021 1653   HGBUR NEGATIVE 07/30/2021 1653   BILIRUBINUR NEGATIVE 07/30/2021 1653   KETONESUR NEGATIVE 07/30/2021 1653   PROTEINUR NEGATIVE 07/30/2021 1653   UROBILINOGEN 0.2 01/26/2015 0903   NITRITE NEGATIVE 07/30/2021 1653   LEUKOCYTESUR NEGATIVE 07/30/2021 1653    Sepsis Labs: Lactic Acid, Venous  Component Value Date/Time   LATICACIDVEN 1.6 07/30/2021 1711    MICROBIOLOGY: Recent Results (from the past 240 hour(s))  Blood Culture (routine x 2)     Status: None   Collection Time: 07/30/21  5:11 PM   Specimen: BLOOD LEFT ARM  Result Value Ref Range Status   Specimen Description BLOOD LEFT ARM  Final   Special Requests   Final    BOTTLES DRAWN AEROBIC AND ANAEROBIC Blood Culture results may not be optimal due to an excessive volume of blood received in culture bottles   Culture   Final    NO GROWTH 5 DAYS Performed at [facility], [place] [address] [place], [place] [address]    Report Status 08/04/2021 FINAL  Final  Urine Culture     Status: Abnormal   Collection Time: 07/30/21  5:50 PM   Specimen: In/Out Cath Urine  Result Value Ref Range Status   Specimen Description IN/OUT  CATH URINE  Final   Special Requests   Final    NONE Performed at [facility], [place] [address], [place], [place] [address]    Culture MULTIPLE SPECIES PRESENT, SUGGEST RECOLLECTION (A)  Final   Report Status 07/31/2021 FINAL  Final  Resp Panel by RT-PCR (Flu A&B, Covid) In/Out Cath Urine     Status: None   Collection Time: 07/30/21  5:50 PM   Specimen: In/Out Cath Urine; Nasopharyngeal(NP) swabs in vial transport medium  Result Value Ref Range Status   SARS Coronavirus 2 by RT PCR NEGATIVE NEGATIVE Final    Comment: (NOTE) SARS-CoV-2 target nucleic acids are NOT DETECTED.  The SARS-CoV-2 RNA is generally detectable in upper respiratory specimens during the acute phase of infection. The lowest concentration of SARS-CoV-2 viral copies this assay can detect is 138 copies/mL. A negative result does not preclude SARS-Cov-2 infection and should not be used as the sole basis for treatment or other patient management decisions. A negative result may occur with  improper specimen collection/handling, submission of specimen other than nasopharyngeal swab, presence of viral mutation(s) within the areas targeted by this assay, and inadequate number of viral copies(<138 copies/mL). A negative result must be combined with clinical observations, patient history, and epidemiological information. The expected result is Negative.  Fact Sheet for Patients:  EntrepreneurPulse.com.au  Fact Sheet for Healthcare Providers:  IncredibleEmployment.be  This test is no t yet approved or cleared by the [place] FDA and  has been authorized for detection and/or diagnosis of SARS-CoV-2 by FDA under an Emergency Use Authorization (EUA). This EUA will remain  in effect (meaning this test can be used) for the duration of the COVID-19 declaration under Section 564(b)(1) of the Act, 21 U.S.C.section 360bbb-3(b)(1), unless the authorization is terminated  or revoked  sooner.       Influenza A by PCR NEGATIVE NEGATIVE Final   Influenza B by PCR NEGATIVE NEGATIVE Final    Comment: (NOTE) The Xpert Xpress SARS-CoV-2/FLU/RSV plus assay is intended as an aid in the diagnosis of influenza from Nasopharyngeal swab specimens and should not be used as a sole basis for treatment. Nasal washings and aspirates are unacceptable for Xpert Xpress SARS-CoV-2/FLU/RSV testing.  Fact Sheet for Patients: EntrepreneurPulse.com.au  Fact Sheet for Healthcare Providers: IncredibleEmployment.be  This test is not yet approved or cleared by the [place] FDA and has been authorized for detection and/or diagnosis of SARS-CoV-2 by FDA under an Emergency Use Authorization (EUA). This EUA will remain in effect (meaning this test can be  used) for the duration of the COVID-19 declaration under Section 564(b)(1) of the Act, 21 U.S.C. section 360bbb-3(b)(1), unless the authorization is terminated or revoked.  Performed at [facility], [place] [address], [place], [place] [address]   Blood Culture (routine x 2)     Status: None   Collection Time: 07/30/21 10:31 PM   Specimen: BLOOD  Result Value Ref Range Status   Specimen Description BLOOD BLOOD RIGHT HAND  Final   Special Requests   Final    BOTTLES DRAWN AEROBIC AND ANAEROBIC Blood Culture adequate volume   Culture   Final    NO GROWTH 5 DAYS Performed at [facility], [place] [address], [place], [place] [address]    Report Status 08/04/2021 FINAL  Final  MRSA Next Gen by PCR, Nasal     Status: None   Collection Time: 07/31/21  1:01 PM   Specimen: Nasal Mucosa; Nasal Swab  Result Value Ref Range Status   MRSA by PCR Next Gen NOT DETECTED NOT DETECTED Final    Comment: (NOTE) The GeneXpert MRSA Assay (FDA approved for NASAL specimens only), is one component of a comprehensive MRSA colonization surveillance program. It is not intended to diagnose MRSA infection nor to  guide or monitor treatment for MRSA infections. Test performance is not FDA approved in patients less than 67 years old. Performed at [facility], [place] [address], [place], [place] [address]     RADIOLOGY STUDIES/RESULTS: No results found.   LOS: 9 days   Oren Binet, MD  [facility]    To contact the attending provider between 7A-7P or the covering provider during after hours 7P-7A, please log into the web site [email or web address] and access using universal [place] password for that web site. If you do not have the password, please call the hospital operator.  08/09/2021, 12:56 PM

## 2021-08-09 NOTE — Progress Notes (Signed)
Physical Therapy Treatment Patient Details Name: William [name] MRN: [id number] DOB: 27-Oct-1965 Today's Date: 08/09/2021   History of Present Illness 55 yo male presenting generalized weakness and bilateral wrist and leg pain. Diagnosed with gout flare; started on colchicine. PMH including EtOH abuse, cholecystitis, liver failure (2018), and gout.    PT Comments    Patient continues to be limited by bil knee and bil UE pain, however pain is improving. Able to stand with +1 moderate assist and walk with RW with min assist up to 50 ft. Patient with poor safety with RW and tends to walk with RLE outside frame of walker due to extremely wide stance/step width. Patient hopeful to return home with roommates, however notes he is not moving well enough to go home yet.    Recommendations for follow up therapy are one component of a multi-disciplinary discharge planning process, led by the attending physician.  Recommendations may be updated based on patient status, additional functional criteria and insurance authorization.  Follow Up Recommendations  Skilled nursing-short term rehab (<3 hours/day)     Assistance Recommended at Discharge Frequent or constant Supervision/Assistance  Equipment Recommendations  Rolling walker (2 wheels)    Recommendations for Other Services       Precautions / Restrictions Precautions Precautions: Fall Precaution Comments: painful bilat knees, R wrist and hand Restrictions Weight Bearing Restrictions: No     Mobility  Bed Mobility Overal bed mobility: Needs Assistance Bed Mobility: Supine to Sit;Sit to Supine     Supine to sit: Supervision Sit to supine: Supervision   General bed mobility comments: moving slowly but no physical assist needed    Transfers Overall transfer level: Needs assistance Equipment used: Rolling walker (2 wheels) Transfers: Sit to/from Stand Sit to Stand: Mod assist           General transfer comment: incr time and  effort to stand as pt using one hand on RW and one to push off bed; from toilet used grab bar and mod assist    Ambulation/Gait Ambulation/Gait assistance: Min guard Gait Distance (Feet): 25 Feet (seated toileting; 50 ft) Assistive device: Rolling walker (2 wheels) Gait Pattern/deviations: Step-to pattern;Decreased stride length;Antalgic;Wide base of support   Gait velocity interpretation: <1.31 ft/sec, indicative of household ambulator General Gait Details: pt consistently with RLE outside frame of walker despite frequent cues to correct; during second bout of ambulation pt with RLE inside frame the majority of steps   Stairs             Wheelchair Mobility    Modified Rankin (Stroke Patients Only)       Balance Overall balance assessment: Needs assistance Sitting-balance support: No upper extremity supported Sitting balance-Leahy Scale: Fair Sitting balance - Comments: able to maintain balance while on eob   Standing balance support: Bilateral upper extremity supported Standing balance-Leahy Scale: Poor Standing balance comment: UE support and physical assist for balance                            Cognition Arousal/Alertness: Awake/alert Behavior During Therapy: WFL for tasks assessed/performed Overall Cognitive Status: No family/caregiver present to determine baseline cognitive functioning Area of Impairment: Problem solving;Awareness;Following commands;Safety/judgement                       Following Commands: Follows multi-step commands with increased time;Follows one step commands inconsistently Safety/Judgement: Decreased awareness of safety Awareness: Emergent Problem Solving: Slow processing General Comments:  incr time with transfers as he wants to do things his way (pulling up on RW that is sliding/moving)        Exercises      General Comments General comments (skin integrity, edema, etc.): pt wanting to go home however states  he knows he cannot manage in his current state      Pertinent Vitals/Pain Pain Assessment: Faces Faces Pain Scale: Hurts little more Pain Location: BLEs and RUE Pain Descriptors / Indicators: Aching;Guarding Pain Intervention(s): Limited activity within patient's tolerance    Home Living                          Prior Function            PT Goals (current goals can now be found in the care plan section) Acute Rehab PT Goals Patient Stated Goal: Stop pain PT Goal Formulation: With patient Time For Goal Achievement: 08/14/21 Potential to Achieve Goals: Fair Progress towards PT goals: Progressing toward goals    Frequency    Min 2X/week      PT Plan Current plan remains appropriate    Co-evaluation              AM-PAC PT "6 Clicks" Mobility   Outcome Measure  Help needed turning from your back to your side while in a flat bed without using bedrails?: A Little Help needed moving from lying on your back to sitting on the side of a flat bed without using bedrails?: A Little Help needed moving to and from a bed to a chair (including a wheelchair)?: A Lot Help needed standing up from a chair using your arms (e.g., wheelchair or bedside chair)?: A Lot Help needed to walk in hospital room?: A Little Help needed climbing 3-5 steps with a railing? : Total 6 Click Score: 14    End of Session Equipment Utilized During Treatment: Gait belt Activity Tolerance: Patient limited by pain Patient left: in bed;with call bell/phone within reach;with bed alarm set Nurse Communication: Mobility status PT Visit Diagnosis: Other abnormalities of gait and mobility (R26.89);Difficulty in walking, not elsewhere classified (R26.2);Muscle weakness (generalized) (M62.81) Pain - Right/Left:  (both) Pain - part of body: Hand;Ankle and joints of foot     Time: 8280-0349 PT Time Calculation (min) (ACUTE ONLY): 18 min  Charges:  $Gait Training: 8-22 mins                       Jerolyn Center, PT Acute Rehabilitation Services  Pager [phone] Office [phone]    Zena Amos 08/09/2021, 11:46 AM

## 2021-08-10 MED ORDER — DICLOFENAC SODIUM 1 % EX GEL
4.0000 g | Freq: Four times a day (QID) | CUTANEOUS | Status: DC
  Administered 2021-08-10 – 2021-08-13 (×12): 4 g via TOPICAL
  Filled 2021-08-10 (×2): qty 100

## 2021-08-10 NOTE — Progress Notes (Signed)
Occupational Therapy Treatment Patient Details Name: William [name] MRN: [id number] DOB: 1966/07/26 Today's Date: 08/10/2021   History of present illness 55 yo male presenting generalized weakness and bilateral wrist and leg pain. Diagnosed with gout flare; started on colchicine. PMH including EtOH abuse, cholecystitis, liver failure (2018), and gout.   OT comments  Patient received in bed and eager to participate with OT treatment. Patient demonstrated good gains on this date with supervision for bed mobility and min guard for mobility and transfers with RW.  Patient was impulsive and required cues to pace self and for safety.  Mobility and transfers performed to address transfer with RW.  Patient considered recliner but asked to return to bed after sitting up a few minutes.  Acute OT to continue follow.    Recommendations for follow up therapy are one component of a multi-disciplinary discharge planning process, led by the attending physician.  Recommendations may be updated based on patient status, additional functional criteria and insurance authorization.    Follow Up Recommendations  Skilled nursing-short term rehab (<3 hours/day)    Assistance Recommended at Discharge Frequent or constant Supervision/Assistance  Equipment Recommendations  [facility]    Recommendations for Other Services      Precautions / Restrictions Precautions Precautions: Fall Precaution Comments: painful bilat knees, R wrist and hand Restrictions Weight Bearing Restrictions: No       Mobility Bed Mobility Overal bed mobility: Needs Assistance Bed Mobility: Supine to Sit;Sit to Supine     Supine to sit: Supervision Sit to supine: Supervision   General bed mobility comments: able to get to eob and to supine without physical assistance    Transfers Overall transfer level: Needs assistance Equipment used: Rolling walker (2 wheels) Transfers: Sit to/from Stand Sit to Stand: Min guard Stand pivot  transfers: Min guard         General transfer comment: patient was min guard to stand for safety     Balance Overall balance assessment: Needs assistance Sitting-balance support: No upper extremity supported Sitting balance-Leahy Scale: Fair Sitting balance - Comments: able to maintain balance while on eob   Standing balance support: Bilateral upper extremity supported Standing balance-Leahy Scale: Poor Standing balance comment: UE support with static standing                           ADL either performed or assessed with clinical judgement   ADL Overall ADL's : Needs assistance/impaired                         Toilet Transfer: Rolling walker (2 wheels);Regular Toilet;Min guard Toilet Transfer Details (indicate cue type and reason): min guard for safety due to unsafe with RW         Functional mobility during ADLs: Min guard;Rolling walker (2 wheels) General ADL Comments: improved mobiltiy and transfers today.  Patient was impulsive and required verbal cues for pacing with RW     Vision       Perception     Praxis      Cognition Arousal/Alertness: Awake/alert Behavior During Therapy: WFL for tasks assessed/performed Overall Cognitive Status: No family/caregiver present to determine baseline cognitive functioning Area of Impairment: Problem solving;Awareness;Following commands;Safety/judgement                       Following Commands: Follows multi-step commands with increased time;Follows one step commands inconsistently Safety/Judgement: Decreased awareness of safety Awareness: Emergent  Problem Solving: Slow processing General Comments: impulsive on this date and unsafe with walker          Exercises     Shoulder Instructions       General Comments      Pertinent Vitals/ Pain       Pain Assessment: Faces Faces Pain Scale: No hurt  Home Living                                          Prior  Functioning/Environment              Frequency  Min 2X/week        Progress Toward Goals  OT Goals(current goals can now be found in the care plan section)  Progress towards OT goals: Progressing toward goals  Acute Rehab OT Goals OT Goal Formulation: With patient Time For Goal Achievement: 08/14/21 Potential to Achieve Goals: Good ADL Goals Pt Will Perform Upper Body Dressing: with supervision;sitting Pt Will Transfer to Toilet: with min assist;stand pivot transfer;bedside commode Additional ADL Goal #1: Pt will perform bed mobility with Min A in preparation for ADLs  Plan Discharge plan remains appropriate    Co-evaluation                 AM-PAC OT "6 Clicks" Daily Activity     Outcome Measure   Help from another person eating meals?: A Little Help from another person taking care of personal grooming?: A Little Help from another person toileting, which includes using toliet, bedpan, or urinal?: A Little Help from another person bathing (including washing, rinsing, drying)?: A Lot Help from another person to put on and taking off regular upper body clothing?: A Little Help from another person to put on and taking off regular lower body clothing?: A Lot 6 Click Score: 16    End of Session Equipment Utilized During Treatment: Gait belt;Rolling walker (2 wheels)  OT Visit Diagnosis: Unsteadiness on feet (R26.81);Other abnormalities of gait and mobility (R26.89);Muscle weakness (generalized) (M62.81);Pain   Activity Tolerance Patient tolerated treatment well   Patient Left in bed;with call bell/phone within reach;with bed alarm set   Nurse Communication Mobility status        Time: 1251-1315 OT Time Calculation (min): 24 min  Charges: OT Treatments $Self Care/Home Management : 8-22 mins $Therapeutic Activity: 8-22 mins  Alfonse Flavors, OTA Acute Rehabilitation Services  Pager [phone] Office [phone]   Dewain Penning 08/10/2021, 1:42 PM

## 2021-08-10 NOTE — Progress Notes (Signed)
Nutrition Follow-up  DOCUMENTATION CODES:   Severe malnutrition in context of chronic illness  INTERVENTION:   Continue Ensure Enlive po TID, each supplement provides 350 kcal and 20 grams of protein  Discontinue Magic Cup  Continue multivitamin w/ minerals daily  Encourage good PO intake  If nutritional needs cannot be met via PO intake, consider Cortrak to provide nutritional support.  NUTRITION DIAGNOSIS:   Severe Malnutrition related to chronic illness (ETOH abuse, gout) as evidenced by severe fat depletion, severe muscle depletion. - Ongoing   GOAL:   Patient will meet greater than or equal to 90% of their needs - Ongoing  MONITOR:   PO intake, Supplement acceptance, Weight trends, Skin, I & O's  REASON FOR ASSESSMENT:   Consult Assessment of nutrition requirement/status, Poor PO  ASSESSMENT:   William [name] is a 55 y.o. male with medical history significant of EtOH abuse, gout.  Recently seen in ED 6 days ago with pain in his wrists and legs bilaterally, diagnosed with gout flare, started on colchicine.  He was seen by visiting nurse at home prior to his presentation and was found to be generally weak.  Had not been off the couch for a while, urinating and stooling self.  Reported decubitus ulcer on backside.  Complaining of generalized pain involving both legs both wrists.  Symptoms are constant, severe, and worsening.  Reviewed imagings, including CT scan, could not completely rule out community-acquired pneumonia.  Procalcitonin elevated 0.72, treated empirically with Rocephin and p.o. doxycycline.  10/17: Heart Healthy Diet 10/20: Dys 3 Diet 10/25: Regular Diet  Pt laying in bed, nurse tech in room as well. Pt more talkative today. Pt repeats himself.   Spoke with tech outside of room, pt not eating much. States that he does better with some meals than others.  Per EMR, pt intake includes 15% for breakfast on 10/26 and 20% for breakfast on 10/28.  Pt states  that he likes the Ensure and chocolate is his favorite.   Patients intake continues to be poor for >1 week. Would recommend a Cortrak be placed to initiate enteral nutrition support.   Medications reviewed and include: Vitamin J68, Folic Acid, MVI, Protonix, Thiamine Labs reviewed.  Diet Order:   Diet Order             Diet regular Room service appropriate? Yes; Fluid consistency: Thin  Diet effective now                   EDUCATION NEEDS:   No education needs have been identified at this time  Skin:  Skin Assessment: Skin Integrity Issues: Skin Integrity Issues:: Stage II Stage II: buttocks  Last BM:  08/08/2021  Height:   Ht Readings from Last 1 Encounters:  07/31/21 5' 11"  (1.803 m)    Weight:   Wt Readings from Last 1 Encounters:  07/31/21 81.5 kg    Ideal Body Weight:  78.2 kg  BMI:  Body mass index is 25.06 kg/m.  Estimated Nutritional Needs:   Kcal:  1157-2620  Protein:  140-155 grams  Fluid:  > 2 L    Jaymi Tinner BS, PLDN Clinical Dietitian See [facility] for contact information.

## 2021-08-10 NOTE — Progress Notes (Signed)
PROGRESS NOTE        PATIENT DETAILS Name: William [name] Age: 55 y.o. Sex: male Date of Birth: [birth year] Admit Date: 07/30/2021 Admitting Physician Kayleen Memos, DO [id number], No Pcp Per (Inactive)  Brief Narrative: Patient is a 55 y.o. male with history of EtOH use, gout, peripheral neuropathy-who presented with generalized arthralgias/myalgias along with generalized weakness.  He was subsequently admitted to the hospitalist service-see below for further details.  Subjective: He continues to acknowledge that there is some mild improvement in his arthralgias every day.  He  Objective: Vitals: Blood pressure 99/73, pulse 83, temperature 97.8 F (36.6 C), temperature source Oral, resp. rate 15, height _0  (1.803 m), weight 81.5 kg, SpO2 96 %.   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  Intra-articular osseous body. 10/27>> x-ray left knee: Moderate degenerative changes. 10/27>> x-ray right ankle: Mild OA 10/27>> x-ray left ankle: Sequelae of prior injury from medial malleolus.  Assessment/Plan: Community-acquired pneumonia: Completed treatment with IV antibiotics-remains stable-on room air.  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  Continue to work with PT/OT-we will need outpatient neurology evaluation for EMG etc.-start vitamin B12 supplementation.   Polyarthralgias: Likely OA-doubt gout-joints are not  inflamed.  ESR/CRP elevated but unclear clinical relevance at this point.  Improving with scheduled NSAIDs.  Radiographs unremarkable.  Will likely need outpatient rheumatology evaluation.   History of gout: Continue colchicine.  Transaminitis: Unclear etiology-could have been related to prior alcohol use-LFTs downtrending.  CT abdomen without any significant abnormalities.  EtOH use: Out of window for withdrawal symptoms.  Right adrenal gland nodule: Seen incidentally on CT abdomen-radiology recommends noncontrast CT abdomen in 6 months to assess stability  Deconditioning/debility/polyneuropathy: Awaiting SNF placement.  But per patient-since his arthralgias are improving-he thinks he may be able to ambulate a bit more-and is considering discharge home if that is possible.  Continue to work with PT/OT.  Nutrition Status: Nutrition Problem: Severe Malnutrition Etiology: chronic illness (ETOH abuse, gout) Signs/Symptoms: severe fat depletion, severe muscle depletion Interventions: MVI, Ensure Enlive (each supplement provides 350kcal and 20 grams of protein), Magic cup   BMI Estimated body mass index is 25.06 kg/m as calculated from the following:   Height as of this encounter: _1  (1.803 m).   Weight as of this encounter: 81.5 kg.   Pressure injury: Pressure Injury 07/30/21 Buttocks Mid;Bilateral Stage 2 -  Partial thickness loss of dermis presenting as a shallow open injury with a red, pink wound bed without slough. stage 2 pressure injury with surrounding deep tissue injury (Active)  07/30/21 2252  Location: Buttocks  Location Orientation: Mid;Bilateral  Staging: Stage 2 -  Partial thickness loss of dermis presenting as a shallow open injury with a red, pink wound bed without slough.  Wound Description (Comments): stage 2 pressure injury with surrounding deep tissue injury  Present on Admission: Yes     Procedures: None Consults: None DVT Prophylaxis: Lovenox Code  Status:Full code  Family Communication: None at bedside  Time spent: 25 minutes-Greater than 50% of this time was spent in counseling, explanation of diagnosis, planning of further management, and coordination of care.  Diet: Diet Order             Diet regular Room service appropriate? Yes; Fluid consistency: Thin  Diet effective now                      Disposition Plan: Status is: Inpatient  Remains inpatient appropriate because: Inpatient level of treatment   Barriers to Discharge: Awaiting SNF  Antimicrobial agents: Anti-infectives (From admission, onward)    Start     Dose/Rate Route Frequency Ordered Stop   07/31/21 1330  cefTRIAXone (ROCEPHIN) 1 g in sodium chloride 0.9 % 100 mL IVPB  Status:  Discontinued        1 g 200 mL/hr over 30 Minutes Intravenous Every 24 hours 07/31/21 1306 08/05/21 1249   07/31/21 1315  doxycycline (VIBRA-TABS) tablet 100 mg        100 mg Oral Every 12 hours 07/31/21 1306 08/04/21 2137        MEDICATIONS: Scheduled Meds:  colchicine  0.6 mg Oral Daily   cyanocobalamin  1,000 mcg Intramuscular Daily   diclofenac Sodium  4 g Topical QID   enoxaparin (LOVENOX) injection  40 mg Subcutaneous Q24H   feeding supplement  237 mL Oral TID BM   folic acid  1 mg Oral Daily   gabapentin  400 mg Oral TID   Gerhardt's butt cream   Topical QID   multivitamin with minerals  1 tablet Oral Daily   naproxen  250 mg Oral BID WC   pantoprazole  40 mg Oral BID   thiamine  100 mg Oral Daily   Continuous Infusions: PRN Meds:.acetaminophen **OR** acetaminophen, ipratropium-albuterol, ondansetron **OR** ondansetron (ZOFRAN) IV   I have personally reviewed following labs and imaging studies  LABORATORY DATA: CBC: Recent Labs  Lab 08/09/21 0136  WBC 7.9  HGB 14.2  HCT 44.1  MCV 93.4  PLT 456*     Basic Metabolic Panel: Recent Labs  Lab 08/09/21 0136  NA 137  K 4.0  CL 102  CO2 26  GLUCOSE 109*  BUN 20  CREATININE 0.84   CALCIUM 9.9     GFR: Estimated Creatinine Clearance: 105.8 mL/min (by C-G formula based on SCr of 0.84 mg/dL).  Liver Function Tests: Recent Labs  Lab 08/09/21 0136  AST 51*  ALT 77*  ALKPHOS 339*  BILITOT 1.3*  PROT 7.1  ALBUMIN 2.9*    No results for input(s): LIPASE, AMYLASE in the last 168 hours. No results for input(s): AMMONIA in the last 168 hours.  Coagulation Profile: No results for input(s): INR, PROTIME in the last 168 hours.  Cardiac Enzymes: No results for input(s): CKTOTAL, CKMB, CKMBINDEX, TROPONINI in the last 168 hours.  BNP (last 3 results) No results for input(s): PROBNP in the last 8760 hours.  Lipid Profile: No results for input(s): CHOL, HDL, LDLCALC, TRIG, CHOLHDL, LDLDIRECT in the last 72 hours.  Thyroid Function Tests: Recent Labs    08/09/21 0136  TSH 3.399     Anemia Panel: Recent Labs    08/09/21 0136  VITAMINB12 186     Urine analysis:    Component Value Date/Time   COLORURINE AMBER (A) 07/30/2021 1653   APPEARANCEUR CLEAR 07/30/2021 1653   LABSPEC 1.021 07/30/2021 1653   PHURINE 7.0 07/30/2021 1653   GLUCOSEU NEGATIVE 07/30/2021  [place] 07/30/2021 [place] 07/30/2021 [place] 07/30/2021 [place] 07/30/2021 1653   UROBILINOGEN 0.2 01/26/2015 0903   NITRITE NEGATIVE 07/30/2021 1653   LEUKOCYTESUR NEGATIVE 07/30/2021 1653    Sepsis Labs: Lactic Acid, Venous    Component Value Date/Time   LATICACIDVEN 1.6 07/30/2021 1711    MICROBIOLOGY: No results found for this or any previous visit (from the past 240 hour(s)).   RADIOLOGY STUDIES/RESULTS: DG Knee 1-2 Views Left  Result Date: 08/09/2021 CLINICAL DATA:  Chronic joint pain. EXAM: LEFT KNEE - 1-2 VIEW; LEFT ANKLE - 2 VIEW COMPARISON:  None. FINDINGS: Left knee: There is moderate medial compartment joint space narrowing and osteophyte formation and mild patellofemoral compartment joint space  narrowing and osteophyte formation. Alignment is anatomic. No acute fractures are seen. Bipartite patella noted. Soft tissues are within normal limits. Left ankle: There is no acute fracture or dislocation. There is a well corticated density adjacent to the tip of the medial malleolus likely related to old injury. Joint spaces are maintained. Soft tissues are within normal limits. IMPRESSION: 1. No acute bony abnormality of the left ankle or left knee. 2. Moderate degenerative changes of the left knee. 3. Sequelae from prior injury of the medial malleolus. Electronically Signed   By: Ronney Asters M.D.   On: 08/09/2021 15:42   DG Knee 1-2 Views Right  Result Date: 08/09/2021 CLINICAL DATA:  Chronic bilateral knee and ankle pain for years. No reported recent injury. Inpatient. EXAM: RIGHT KNEE - 1-2 VIEW COMPARISON:  None. FINDINGS: Irregular well corticated 2.6 cm osseous body in the suprapatellar right knee joint. No acute fracture. No joint effusion. No dislocation. No suspicious focal osseous lesions. 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 No joint effusion. No acute osseous abnormality. Electronically Signed   By: Ilona Sorrel M.D.   On: 08/09/2021 15:42   DG Ankle 2 Views Left  Result Date: 08/09/2021 CLINICAL DATA:  Chronic joint pain. EXAM: LEFT KNEE - 1-2 VIEW; LEFT ANKLE - 2 VIEW COMPARISON:  None. FINDINGS: Left knee: There is moderate medial compartment joint space narrowing and osteophyte formation and mild patellofemoral compartment joint space narrowing and osteophyte formation. Alignment is anatomic. No acute fractures are seen. Bipartite patella noted. Soft tissues are within normal limits. Left ankle: There is no acute fracture or dislocation.  There is a well corticated density adjacent to the tip of the medial malleolus likely related to old injury. Joint spaces are maintained. Soft tissues are within normal limits. IMPRESSION: 1. No acute bony abnormality of the left ankle or left knee. 2. Moderate degenerative changes of the left knee. 3. Sequelae from prior injury of the medial malleolus. Electronically Signed   By: Ronney Asters M.D.   On: 08/09/2021 15:42   DG Ankle 2 Views Right  Result Date: 08/09/2021 CLINICAL DATA:  Chronic bilateral ankle and knee pain for years, no known injuries, inpatient EXAM: RIGHT ANKLE - 2 VIEW COMPARISON:  None. FINDINGS: No fracture or subluxation. No the osseous lesions. Vascular calcifications in the anterior right ankle soft tissues. Mild osteoarthritis with small marginal osteophytes in the right ankle joint. No radiopaque foreign bodies. IMPRESSION: Mild right ankle joint osteoarthritis. Electronically Signed   By: Ilona Sorrel M.D.   On: 08/09/2021 15:40  LOS: 10 days   Oren Binet, MD  [facility]    To contact the attending provider between 7A-7P or the covering provider during after hours 7P-7A, please log into the web site [email or web address] and access using universal [place] password for that web site. If you do not have the password, please call the hospital operator.  08/10/2021, 2:56 PM

## 2021-08-10 NOTE — TOC Initial Note (Addendum)
Transition of Care [facility]) - Initial/Assessment Note    Patient Details  Name: William [name] MRN: [id number] Date of Birth: Feb 23, 1966  Transition [facility]) CM/SW Contact:    Delynda Sepulveda Colman Cater, Student-Social Work Phone Number: 08/10/2021, 3:55 PM  Clinical Narrative:                 CSW intern spoke with the patient at bedside, the patient states he is feeling better and plans to go home after discharge. He currently has 3 roommates that he states will be able to care for him as they have been best friends since childhood, however he does not have a Maritssa Haughton in the home.The patient states he needs assistance with applying for medicaid and disability, my supervisor is aware and has contacted financial counseling.   Expected Discharge Plan: Home Barriers to Discharge: Continued Medical Work up, SNF Pending payor source - LOG, SNF Pending bed offer, Unsafe home situation   Patient Goals and CMS Choice        Expected Discharge Plan and Services Expected Discharge Plan: Home In-house Referral: Clinical Social Work     Living arrangements for the past 2 months: Single Family Home                                      Prior Living Arrangements/Services Living arrangements for the past 2 months: Single Family Home Lives with:: Self, Siblings Patient language and need for interpreter reviewed:: Yes        Need for Family Participation in Patient Care: Yes (Comment) Care giver support system in place?: No (comment)   Criminal Activity/Legal Involvement Pertinent to Current Situation/Hospitalization: No - Comment as needed  Activities of Daily Living Home Assistive Devices/Equipment: Crutches ADL Screening (condition at time of admission) Patient's cognitive ability adequate to safely complete daily activities?: Yes Is the patient deaf or have difficulty hearing?: No Does the patient have difficulty seeing, even when wearing glasses/contacts?: Yes Does the patient have  difficulty concentrating, remembering, or making decisions?: No Patient able to express need for assistance with ADLs?: Yes Does the patient have difficulty dressing or bathing?: Yes Independently performs ADLs?: No Communication: Independent Dressing (OT): Needs assistance Is this a change from baseline?: Change from baseline, expected to last >3 days Grooming: Needs assistance Is this a change from baseline?: Change from baseline, expected to last >3 days Feeding: Independent Bathing: Needs assistance Is this a change from baseline?: Change from baseline, expected to last >3 days Toileting: Needs assistance Is this a change from baseline?: Change from baseline, expected to last <3 days In/Out Bed: Needs assistance Is this a change from baseline?: Change from baseline, expected to last >3 days Walks in Home: Needs assistance Is this a change from baseline?: Change from baseline, expected to last >3 days Does the patient have difficulty walking or climbing stairs?: Yes Weakness of Legs: Both Weakness of Arms/Hands: None  Permission Sought/Granted                  Emotional Assessment Appearance:: Appears stated age     Orientation: : Oriented to Self, Oriented to Place, Oriented to  Time, Oriented to Situation Alcohol / Substance Use: Not Applicable Psych Involvement: No (comment)  Admission diagnosis:  SIRS (systemic inflammatory response syndrome) (HCC) [R65.10] Generalized weakness [R53.1] Febrile illness, acute [R50.9] LFT elevation [R79.89] Gout of multiple sites, unspecified cause, unspecified chronicity [M10.9] Patient  Active Problem List   Diagnosis Date Noted   Protein-calorie malnutrition, severe 08/04/2021   Generalized weakness 07/30/2021   SIRS (systemic inflammatory response syndrome) (HCC) 07/30/2021   Pressure injury of skin 07/30/2021   Choledocholithiasis 04/05/2017   Liver failure (HCC) 04/04/2017   History of gout 04/04/2017   Elevated  blood-pressure reading without diagnosis of hypertension 04/04/2017   Acute hyponatremia 04/04/2017   Acute hyperglycemia 04/04/2017   Alcoholism (HCC) 04/04/2017   Acute alcoholic hepatitis 04/04/2017   Cholelithiasis 04/04/2017   Dilated cbd, acquired    Jaundice    Elevated LFTs    PCP:  Patient, No Pcp Per (Inactive) Pharmacy:   [facility] [address] ([address]), [place] - [address] [place] ([place]) [place] [address] Phone: [phone] Fax: [phone]     Social Determinants of Health (SDOH) Interventions    Readmission Risk Interventions No flowsheet data found.

## 2021-08-11 NOTE — Progress Notes (Addendum)
PROGRESS NOTE        PATIENT DETAILS Name: William [name] Age: 55 y.o. Sex: male Date of Birth: 1966/02/14 Admit Date: 07/30/2021 Admitting Physician Kayleen Memos, DO ELF:[id number], No Pcp Per (Inactive)  Brief Narrative: Patient is a 55 y.o. male with history of EtOH use, gout, peripheral neuropathy-who presented with generalized arthralgias/myalgias along with generalized weakness.  He was subsequently admitted to the hospitalist service-see below for further details.  Subjective: Arthralgias slowly improving-he thinks he is getting stronger.  Claims he was able to ambulate with help around the room.  Objective: Vitals: Blood pressure 125/78, pulse 74, temperature 98.2 F (36.8 C), temperature source Axillary, resp. rate 17, height 5' 11"  (1.803 m), weight 81.5 kg, SpO2 96 %.   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  Intra-articular osseous body. 10/27>> x-ray left knee: Moderate degenerative changes. 10/27>> x-ray right ankle: Mild OA 10/27>> x-ray left ankle: Sequelae of prior injury from medial malleolus.  Assessment/Plan: Community-acquired pneumonia: Completed treatment with IV antibiotics-remains stable-on room air.  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  Continue to work with PT/OT-we will need outpatient neurology evaluation for EMG etc.-start vitamin B12 supplementation.   Polyarthralgias: Likely  OA-doubt gout-joints are not inflamed.  ESR/CRP elevated but unclear clinical relevance at this point.  Improving with scheduled NSAIDs.  Radiographs unremarkable.  Will likely need outpatient rheumatology evaluation.   History of gout: Continue colchicine.  Transaminitis: Unclear etiology-could have been related to prior alcohol use-LFTs downtrending.  CT abdomen without any significant abnormalities.  EtOH use: Out of window for withdrawal symptoms.  Right adrenal gland nodule: Seen incidentally on CT abdomen-radiology recommends noncontrast CT abdomen in 6 months to assess stability  Deconditioning/debility/polyneuropathy: Awaiting SNF placement.  But per patient-since his arthralgias are improving-he thinks he may be able to ambulate a bit more-and is considering discharge home if that is possible.  Continue to work with PT/OT.  Nutrition Status: Nutrition Problem: Severe Malnutrition Etiology: chronic illness (ETOH abuse, gout) Signs/Symptoms: severe fat depletion, severe muscle depletion Interventions: MVI, Ensure Enlive (each supplement provides 350kcal and 20 grams of protein), Magic cup   BMI Estimated body mass index is 25.06 kg/m as calculated from the following:   Height as of this encounter: 5' 11"  (1.803 m).   Weight as of this encounter: 81.5 kg.   Pressure injury: Pressure Injury 07/30/21 Buttocks Mid;Bilateral Stage 2 -  Partial thickness loss of dermis presenting as a shallow open injury with a red, pink wound bed without slough. stage 2 pressure injury with surrounding deep tissue injury (Active)  07/30/21 2252  Location: Buttocks  Location Orientation: Mid;Bilateral  Staging: Stage 2 -  Partial thickness loss of dermis presenting as a shallow open injury with a red, pink wound bed without slough.  Wound Description (Comments): stage 2 pressure injury with surrounding deep tissue injury  Present on Admission: Yes     Procedures: None Consults: None DVT  Prophylaxis: Lovenox Code  Status:Full code  Family Communication: None at bedside  Time spent: 15 minutes-Greater than 50% of this time was spent in counseling, explanation of diagnosis, planning of further management, and coordination of care.  Diet: Diet Order             Diet regular Room service appropriate? Yes; Fluid consistency: Thin  Diet effective now                      Disposition Plan: Status is: Inpatient  Remains inpatient appropriate because: Inpatient level of treatment   Barriers to Discharge: Awaiting SNF  Antimicrobial agents: Anti-infectives (From admission, onward)    Start     Dose/Rate Route Frequency Ordered Stop   07/31/21 1330  cefTRIAXone (ROCEPHIN) 1 g in sodium chloride 0.9 % 100 mL IVPB  Status:  Discontinued        1 g 200 mL/hr over 30 Minutes Intravenous Every 24 hours 07/31/21 1306 08/05/21 1249   07/31/21 1315  doxycycline (VIBRA-TABS) tablet 100 mg        100 mg Oral Every 12 hours 07/31/21 1306 08/04/21 2137        MEDICATIONS: Scheduled Meds:  colchicine  0.6 mg Oral Daily   cyanocobalamin  1,000 mcg Intramuscular Daily   diclofenac Sodium  4 g Topical QID   enoxaparin (LOVENOX) injection  40 mg Subcutaneous Q24H   feeding supplement  237 mL Oral TID BM   folic acid  1 mg Oral Daily   gabapentin  400 mg Oral TID   Gerhardt's butt cream   Topical QID   multivitamin with minerals  1 tablet Oral Daily   naproxen  250 mg Oral BID WC   pantoprazole  40 mg Oral BID   thiamine  100 mg Oral Daily   Continuous Infusions: PRN Meds:.acetaminophen **OR** acetaminophen, ipratropium-albuterol, ondansetron **OR** ondansetron (ZOFRAN) IV   I have personally reviewed following labs and imaging studies  LABORATORY DATA: CBC: Recent Labs  Lab 08/09/21 0136  WBC 7.9  HGB 14.2  HCT 44.1  MCV 93.4  PLT 456*     Basic Metabolic Panel: Recent Labs  Lab 08/09/21 0136  NA 137  K 4.0  CL 102  CO2 26  GLUCOSE 109*  BUN  20  CREATININE 0.84  CALCIUM 9.9     GFR: Estimated Creatinine Clearance: 105.8 mL/min (by C-G formula based on SCr of 0.84 mg/dL).  Liver Function Tests: Recent Labs  Lab 08/09/21 0136  AST 51*  ALT 77*  ALKPHOS 339*  BILITOT 1.3*  PROT 7.1  ALBUMIN 2.9*    No results for input(s): LIPASE, AMYLASE in the last 168 hours. No results for input(s): AMMONIA in the last 168 hours.  Coagulation Profile: No results for input(s): INR, PROTIME in the last 168 hours.  Cardiac Enzymes: No results for input(s): CKTOTAL, CKMB, CKMBINDEX, TROPONINI in the last 168 hours.  BNP (last 3 results) No results for input(s): PROBNP in the last 8760 hours.  Lipid Profile: No results for input(s): CHOL, HDL, LDLCALC, TRIG, CHOLHDL, LDLDIRECT in the last 72 hours.  Thyroid Function Tests: Recent Labs    08/09/21 0136  TSH 3.399     Anemia Panel: Recent Labs    08/09/21 0136  VITAMINB12 186     Urine analysis:    Component Value Date/Time   COLORURINE AMBER (A) 07/30/2021 [place] 07/30/2021 1653   LABSPEC 1.021 07/30/2021 1653   PHURINE 7.0 07/30/2021 1653  GLUCOSEU NEGATIVE 07/30/2021 [place] 07/30/2021 [place] 07/30/2021 [place] 07/30/2021 1653   PROTEINUR NEGATIVE 07/30/2021 1653   UROBILINOGEN 0.2 01/26/2015 0903   NITRITE NEGATIVE 07/30/2021 1653   LEUKOCYTESUR NEGATIVE 07/30/2021 1653    Sepsis Labs: Lactic Acid, Venous    Component Value Date/Time   LATICACIDVEN 1.6 07/30/2021 1711    MICROBIOLOGY: No results found for this or any previous visit (from the past 240 hour(s)).   RADIOLOGY STUDIES/RESULTS: DG Knee 1-2 Views Left  Result Date: 08/09/2021 CLINICAL DATA:  Chronic joint pain. EXAM: LEFT KNEE - 1-2 VIEW; LEFT ANKLE - 2 VIEW COMPARISON:  None. FINDINGS: Left knee: There is moderate medial compartment joint space narrowing and osteophyte formation and mild patellofemoral  compartment joint space narrowing and osteophyte formation. Alignment is anatomic. No acute fractures are seen. Bipartite patella noted. Soft tissues are within normal limits. Left ankle: There is no acute fracture or dislocation. There is a well corticated density adjacent to the tip of the medial malleolus likely related to old injury. Joint spaces are maintained. Soft tissues are within normal limits. IMPRESSION: 1. No acute bony abnormality of the left ankle or left knee. 2. Moderate degenerative changes of the left knee. 3. Sequelae from prior injury of the medial malleolus. Electronically Signed   By: Ronney Asters M.D.   On: 08/09/2021 15:42   DG Knee 1-2 Views Right  Result Date: 08/09/2021 CLINICAL DATA:  Chronic bilateral knee and ankle pain for years. No reported recent injury. Inpatient. EXAM: RIGHT KNEE - 1-2 VIEW COMPARISON:  None. FINDINGS: Irregular well corticated 2.6 cm osseous body in the suprapatellar right knee joint. No acute fracture. No joint effusion. No dislocation. No suspicious focal osseous lesions. 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 No joint effusion. No acute osseous abnormality. Electronically Signed   By: Ilona Sorrel M.D.   On: 08/09/2021 15:42   DG Ankle 2 Views Left  Result Date: 08/09/2021 CLINICAL DATA:  Chronic joint pain. EXAM: LEFT KNEE - 1-2 VIEW; LEFT ANKLE - 2 VIEW COMPARISON:  None. FINDINGS: Left knee: There is moderate medial compartment joint space narrowing and osteophyte formation and mild patellofemoral compartment joint space narrowing and osteophyte formation. Alignment is anatomic. No acute fractures are seen. Bipartite patella noted. Soft tissues are within normal limits. Left ankle: There is no acute  fracture or dislocation. There is a well corticated density adjacent to the tip of the medial malleolus likely related to old injury. Joint spaces are maintained. Soft tissues are within normal limits. IMPRESSION: 1. No acute bony abnormality of the left ankle or left knee. 2. Moderate degenerative changes of the left knee. 3. Sequelae from prior injury of the medial malleolus. Electronically Signed   By: Ronney Asters M.D.   On: 08/09/2021 15:42   DG Ankle 2 Views Right  Result Date: 08/09/2021 CLINICAL DATA:  Chronic bilateral ankle and knee pain for years, no known injuries, inpatient EXAM: RIGHT ANKLE - 2 VIEW COMPARISON:  None. FINDINGS: No fracture or subluxation. No the osseous lesions. Vascular calcifications in the anterior right ankle soft tissues. Mild osteoarthritis with small marginal osteophytes in the right ankle joint. No radiopaque foreign bodies. IMPRESSION: Mild right ankle joint osteoarthritis. Electronically Signed   By: Ilona Sorrel M.D.   On: 08/09/2021  15:40     LOS: 11 days   Oren Binet, MD  [facility]    To contact the attending provider between 7A-7P or the covering provider during after hours 7P-7A, please log into the web site [email or web address] and access using universal [place] password for that web site. If you do not have the password, please call the hospital operator.  08/11/2021, 1:18 PM

## 2021-08-12 NOTE — Progress Notes (Signed)
PROGRESS NOTE        PATIENT DETAILS Name: William [name] Age: 55 y.o. Sex: male Date of Birth: November 03, 1965 Admit Date: 07/30/2021 Admitting Physician Kayleen Memos, DO [id number], No Pcp Per (Inactive)  Brief Narrative: Patient is a 55 y.o. male with history of EtOH use, gout, peripheral neuropathy-who presented with generalized arthralgias/myalgias along with generalized weakness.  He was subsequently admitted to the hospitalist service-see below for further details.  Subjective: Lying comfortably in bed-claims he ambulated somewhat in the room yesterday-and sat in bedside chair the whole day.  Arthralgias are slowly improving.  Objective: Vitals: Blood pressure 105/74, pulse 83, temperature 97.8 F (36.6 C), temperature source Oral, resp. rate 17, height 5' 11"  (1.803 m), weight 81.5 kg, SpO2 98 %.   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  Intra-articular osseous body. 10/27>> x-ray left knee: Moderate degenerative changes. 10/27>> x-ray right ankle: Mild OA 10/27>> x-ray left ankle: Sequelae of prior injury from medial malleolus.  Assessment/Plan: Community-acquired pneumonia: Completed treatment with IV antibiotics-remains stable-on room air.  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  Continue to work with PT/OT-we will need outpatient neurology evaluation for EMG etc.-start vitamin B12 supplementation.    Polyarthralgias: Likely OA-doubt gout-joints are not inflamed.  ESR/CRP elevated but unclear clinical relevance at this point.  Improving with scheduled NSAIDs.  Radiographs unremarkable.  Will likely need outpatient rheumatology evaluation.   History of gout: Continue colchicine.  Transaminitis: Unclear etiology-could have been related to prior alcohol use-LFTs downtrending.  CT abdomen without any significant abnormalities.  EtOH use: Out of window for withdrawal symptoms.  Right adrenal gland nodule: Seen incidentally on CT abdomen-radiology recommends noncontrast CT abdomen in 6 months to assess stability  Deconditioning/debility/polyneuropathy: Awaiting SNF placement.  But per patient-since his arthralgias are improving-he thinks he may be able to ambulate a bit more-and is considering discharge home if that is possible.  Continue to work with PT/OT.  Nutrition Status: Nutrition Problem: Severe Malnutrition Etiology: chronic illness (ETOH abuse, gout) Signs/Symptoms: severe fat depletion, severe muscle depletion Interventions: MVI, Ensure Enlive (each supplement provides 350kcal and 20 grams of protein), Magic cup   BMI Estimated body mass index is 25.06 kg/m as calculated from the following:   Height as of this encounter: 5' 11"  (1.803 m).   Weight as of this encounter: 81.5 kg.   Pressure injury: Pressure Injury 07/30/21 Buttocks Mid;Bilateral Stage 2 -  Partial thickness loss of dermis presenting as a shallow open injury with a red, pink wound bed without slough. stage 2 pressure injury with surrounding deep tissue injury (Active)  07/30/21 2252  Location: Buttocks  Location Orientation: Mid;Bilateral  Staging: Stage 2 -  Partial thickness loss of dermis presenting as a shallow open injury with a red, pink wound bed without slough.  Wound Description (Comments): stage 2 pressure injury with surrounding deep tissue injury  Present on Admission: Yes     Procedures:  None Consults: None DVT  Prophylaxis: Lovenox Code Status:Full code  Family Communication: None at bedside  Time spent: 15 minutes-Greater than 50% of this time was spent in counseling, explanation of diagnosis, planning of further management, and coordination of care.  Diet: Diet Order             Diet regular Room service appropriate? Yes; Fluid consistency: Thin  Diet effective now                      Disposition Plan: Status is: Inpatient  Remains inpatient appropriate because: Inpatient level of treatment   Barriers to Discharge: Awaiting SNF  Antimicrobial agents: Anti-infectives (From admission, onward)    Start     Dose/Rate Route Frequency Ordered Stop   07/31/21 1330  cefTRIAXone (ROCEPHIN) 1 g in sodium chloride 0.9 % 100 mL IVPB  Status:  Discontinued        1 g 200 mL/hr over 30 Minutes Intravenous Every 24 hours 07/31/21 1306 08/05/21 1249   07/31/21 1315  doxycycline (VIBRA-TABS) tablet 100 mg        100 mg Oral Every 12 hours 07/31/21 1306 08/04/21 2137        MEDICATIONS: Scheduled Meds:  colchicine  0.6 mg Oral Daily   cyanocobalamin  1,000 mcg Intramuscular Daily   diclofenac Sodium  4 g Topical QID   enoxaparin (LOVENOX) injection  40 mg Subcutaneous Q24H   feeding supplement  237 mL Oral TID BM   folic acid  1 mg Oral Daily   gabapentin  400 mg Oral TID   Gerhardt's butt cream   Topical QID   multivitamin with minerals  1 tablet Oral Daily   naproxen  250 mg Oral BID WC   pantoprazole  40 mg Oral BID   thiamine  100 mg Oral Daily   Continuous Infusions: PRN Meds:.acetaminophen **OR** acetaminophen, ipratropium-albuterol, ondansetron **OR** ondansetron (ZOFRAN) IV   I have personally reviewed following labs and imaging studies  LABORATORY DATA: CBC: Recent Labs  Lab 08/09/21 0136  WBC 7.9  HGB 14.2  HCT 44.1  MCV 93.4  PLT 456*     Basic Metabolic Panel: Recent Labs  Lab 08/09/21 0136  NA 137  K 4.0  CL 102   CO2 26  GLUCOSE 109*  BUN 20  CREATININE 0.84  CALCIUM 9.9     GFR: Estimated Creatinine Clearance: 105.8 mL/min (by C-G formula based on SCr of 0.84 mg/dL).  Liver Function Tests: Recent Labs  Lab 08/09/21 0136  AST 51*  ALT 77*  ALKPHOS 339*  BILITOT 1.3*  PROT 7.1  ALBUMIN 2.9*    No results for input(s): LIPASE, AMYLASE in the last 168 hours. No results for input(s): AMMONIA in the last 168 hours.  Coagulation Profile: No results for input(s): INR, PROTIME in the last 168 hours.  Cardiac Enzymes: No results for input(s): CKTOTAL, CKMB, CKMBINDEX, TROPONINI in the last 168 hours.  BNP (last 3 results) No results for input(s): PROBNP in the last 8760 hours.  Lipid Profile: No results for input(s): CHOL, HDL, LDLCALC, TRIG, CHOLHDL, LDLDIRECT in the last 72 hours.  Thyroid Function Tests: No results for input(s): TSH, T4TOTAL, FREET4, T3FREE, THYROIDAB in the last 72 hours.   Anemia Panel: No results for input(s): VITAMINB12, FOLATE, FERRITIN, TIBC, IRON, RETICCTPCT in the last 72 hours.   Urine analysis:    Component Value Date/Time   COLORURINE AMBER (A) 07/30/2021 [place] 07/30/2021 1653   LABSPEC 1.021 07/30/2021 1653  PHURINE 7.0 07/30/2021 1653   GLUCOSEU NEGATIVE 07/30/2021 1653   HGBUR NEGATIVE 07/30/2021 [place] 07/30/2021 [place] 07/30/2021 1653   PROTEINUR NEGATIVE 07/30/2021 1653   UROBILINOGEN 0.2 01/26/2015 0903   NITRITE NEGATIVE 07/30/2021 1653   LEUKOCYTESUR NEGATIVE 07/30/2021 1653    Sepsis Labs: Lactic Acid, Venous    Component Value Date/Time   LATICACIDVEN 1.6 07/30/2021 1711    MICROBIOLOGY: No results found for this or any previous visit (from the past 240 hour(s)).   RADIOLOGY STUDIES/RESULTS: No results found.   LOS: 12 days   Oren Binet, MD  [facility]    To contact the attending provider between 7A-7P or the covering provider during  after hours 7P-7A, please log into the web site [email or web address] and access using universal [place] password for that web site. If you do not have the password, please call the hospital operator.  08/12/2021, 2:05 PM

## 2021-08-13 ENCOUNTER — Other Ambulatory Visit (HOSPITAL_COMMUNITY): Payer: Self-pay

## 2021-08-13 DIAGNOSIS — G6289 Other specified polyneuropathies: Secondary | ICD-10-CM

## 2021-08-13 MED ORDER — VITAMIN B-12 1000 MCG PO TABS
1000.0000 ug | ORAL_TABLET | Freq: Every day | ORAL | 3 refills | Status: DC
  Filled 2021-08-13: qty 30, 30d supply, fill #0

## 2021-08-13 MED ORDER — DICLOFENAC SODIUM 1 % EX GEL
4.0000 g | Freq: Four times a day (QID) | CUTANEOUS | 2 refills | Status: AC
  Filled 2021-08-13: qty 100, 7d supply, fill #0

## 2021-08-13 MED ORDER — PANTOPRAZOLE SODIUM 40 MG PO TBEC
40.0000 mg | DELAYED_RELEASE_TABLET | Freq: Every day | ORAL | 2 refills | Status: DC
  Filled 2021-08-13: qty 30, 30d supply, fill #0

## 2021-08-13 MED ORDER — GABAPENTIN 400 MG PO CAPS
400.0000 mg | ORAL_CAPSULE | Freq: Three times a day (TID) | ORAL | 2 refills | Status: DC
  Filled 2021-08-13: qty 90, 30d supply, fill #0

## 2021-08-13 MED ORDER — THIAMINE HCL 100 MG PO TABS
100.0000 mg | ORAL_TABLET | Freq: Every day | ORAL | 0 refills | Status: AC
  Filled 2021-08-13: qty 30, 30d supply, fill #0

## 2021-08-13 MED ORDER — COLCHICINE 0.6 MG PO TABS
0.6000 mg | ORAL_TABLET | Freq: Every day | ORAL | 2 refills | Status: DC
  Filled 2021-08-13: qty 30, 30d supply, fill #0

## 2021-08-13 MED ORDER — INDOMETHACIN 25 MG PO CAPS
25.0000 mg | ORAL_CAPSULE | Freq: Three times a day (TID) | ORAL | 0 refills | Status: DC | PRN
  Filled 2021-08-13: qty 45, 15d supply, fill #0

## 2021-08-13 NOTE — Discharge Summary (Addendum)
PATIENT DETAILS Name: William [name] Age: 55 y.o. Sex: male Date of Birth: 14-Jun-1966 MRN: [id number]. Admitting Physician: Kayleen Memos, DO IFO:[id number], No Pcp Per (Inactive)  Admit Date: 07/30/2021 Discharge date: 08/13/2021  Recommendations for Outpatient Follow-up:  Follow up with PCP in 1-2 weeks Please obtain CMP/CBC in one week Please ensure follow-up with neurology, rheumatology Incidental nodule on adrenal gland on CT abdomen-needs repeat CT scan in 6 months.  Admitted From:  Home  Disposition: [place]: No  Equipment/Devices: None  Discharge Condition: Stable  CODE STATUS: FULL CODE  Diet recommendation:  Diet Order             Diet general           Diet regular Room service appropriate? Yes; Fluid consistency: Thin  Diet effective now                    Brief Summary: Patient is a 55 y.o. male with history of EtOH use, gout, peripheral neuropathy-who presented with generalized arthralgias/myalgias along with generalized weakness.  He was subsequently admitted to the hospitalist service-see below for further details.  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  Intra-articular osseous body. 10/27>> x-ray left knee: Moderate degenerative changes. 10/27>> x-ray right ankle: Mild OA 10/27>> x-ray left ankle: Sequelae of prior injury from medial malleolus.    Brief Hospital Course: Community-acquired pneumonia: Completed treatment with IV antibiotics-remains stable-on room air.   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  He was evaluated by rehab services-and felt initially required PT/OT-but with supportive care with  NSAIDs/vitamin B12 supplementation/Neurontin-he has improved significantly-and is now able to ambulate in the room with help of a walker.  He was reevaluated by physical therapy on 10/31-and felt to be stable enough to be discharged home.  He will need outpatient follow-up with neurology for possible EMG etc.-and needs continued vitamin B12 supplementation    Polyarthralgias: Likely OA-doubt gout-joints are not inflamed.  ESR/CRP elevated but unclear clinical relevance at this point.  Improving with scheduled NSAIDs.  Radiographs unremarkable.  Will likely need outpatient rheumatology evaluation.    History of gout: Continue colchicine.   Transaminitis: Unclear etiology-could have been related to prior alcohol use-LFTs downtrending.  CT abdomen without any significant abnormalities.   EtOH use: Out of window for withdrawal symptoms.   Right adrenal gland nodule: Seen incidentally on CT abdomen-radiology recommends noncontrast CT abdomen in 6 months to assess stability   Deconditioning/debility/polyneuropathy:  He was evaluated by rehab services-and felt initially required PT/OT-but with supportive care with NSAIDs/vitamin B12 supplementation/Neurontin-he has improved significantly-and is now able to ambulate in the room with help of a walker.  He was reevaluated by physical therapy on 10/31-and felt to be stable enough to be discharged home.   Nutrition Status: Nutrition Problem: Severe Malnutrition Etiology: chronic illness (ETOH abuse, gout) Signs/Symptoms: severe fat depletion, severe muscle depletion Interventions: MVI, Ensure Enlive (each supplement provides 350kcal and 20 grams of protein), Magic cup   BMI Estimated body mass index is 25.06 kg/m as calculated from the following:   Height as of this encounter: 5' 11"  (1.803 m).   Weight as of this encounter: 81.5 kg.   RN pressure injury documentation: Pressure Injury 07/30/21  Buttocks Mid;Bilateral Stage 2 -  Partial thickness loss  of dermis presenting as a shallow open injury with a red, pink wound bed without slough. stage 2 pressure injury with surrounding deep tissue injury (Active)  07/30/21 2252  Location: Buttocks  Location Orientation: Mid;Bilateral  Staging: Stage 2 -  Partial thickness loss of dermis presenting as a shallow open injury with a red, pink wound bed without slough.  Wound Description (Comments): stage 2 pressure injury with surrounding deep tissue injury  Present on Admission: Yes    Procedures None  Discharge Diagnoses:  Principal Problem:   SIRS (systemic inflammatory response syndrome) (HCC) Active Problems:   History of gout   Alcoholism ([place])   Elevated LFTs   Generalized weakness   Pressure injury of skin   Protein-calorie malnutrition, severe   Discharge Instructions:  Activity:  As tolerated with Full fall precautions use walker/cane & assistance as needed  Discharge Instructions     Ambulatory referral to Neurology   Complete by: As directed    An appointment is requested in approximately: 8 weeks   Call MD for:  persistant nausea and vomiting   Complete by: As directed    Call MD for:  severe uncontrolled pain   Complete by: As directed    Diet general   Complete by: As directed    Discharge instructions   Complete by: As directed    Follow with Primary MD  in 1-2 weeks  Please get a complete blood count and chemistry panel checked by your Primary MD at your next visit, and again as instructed by your Primary MD.  Get Medicines reviewed and adjusted: Please take all your medications with you for your next visit with your Primary MD  Laboratory/radiological data: Please request your Primary MD to go over all hospital tests and procedure/radiological results at the follow up, please ask your Primary MD to get all Hospital records sent to his/her office.  In some cases, they will be blood work, cultures and biopsy results pending at the time of your discharge.  Please request that your primary care M.D. follows up on these results.  Also Note the following: If you experience worsening of your admission symptoms, develop shortness of breath, life threatening emergency, suicidal or homicidal thoughts you must seek medical attention immediately by calling 911 or calling your MD immediately  if symptoms less severe.  You must read complete instructions/literature along with all the possible adverse reactions/side effects for all the Medicines you take and that have been prescribed to you. Take any new Medicines after you have completely understood and accpet all the possible adverse reactions/side effects.   Do not drive when taking Pain medications or sleeping medications (Benzodaizepines)  Do not take more than prescribed Pain, Sleep and Anxiety Medications. It is not advisable to combine anxiety,sleep and pain medications without talking with your primary care practitioner  Special Instructions: If you have smoked or chewed Tobacco  in the last 2 yrs please stop smoking, stop any regular Alcohol  and or any Recreational drug use.  Wear Seat belts while driving.  Please note: You were cared for by a hospitalist during your hospital stay. Once you are discharged, your primary care physician will handle any further medical issues. Please note that NO REFILLS for any discharge medications will be authorized once you are discharged, as it is imperative that you return to your primary care physician (or establish a relationship with a primary care physician if you do not  have one) for your post hospital discharge needs so that they can reassess your need for medications and monitor your lab values.   1.)  Incidental finding on CT-adrenal nodule-please ask your primary care practitioner to repeat a CT scan of abdomen in 6 months.   Increase activity slowly   Complete by: As directed    No dressing needed   Complete by: As directed       Allergies as of  08/13/2021   No Known Allergies      Medication List     STOP taking these medications    chlorhexidine 0.12 % solution Commonly known as: Peridex   predniSONE 20 MG tablet Commonly known as: DELTASONE       TAKE these medications    colchicine 0.6 MG tablet Take 1 tablet (0.6 mg total) by mouth daily.   diclofenac Sodium 1 % Gel Commonly known as: VOLTAREN Apply 4 g topically 4 (four) times daily.   gabapentin 400 MG capsule Commonly known as: NEURONTIN Take 1 capsule (400 mg total) by mouth 3 (three) times daily.   indomethacin 25 MG capsule Commonly known as: INDOCIN Take 1 capsule (25 mg total) by mouth 3 (three) times daily as needed for moderate pain. What changed: reasons to take this   pantoprazole 40 MG tablet Commonly known as: PROTONIX Take 1 tablet (40 mg total) by mouth daily.   thiamine 100 MG tablet Take 1 tablet (100 mg total) by mouth daily.   vitamin B-12 1000 MCG tablet Commonly known as: CYANOCOBALAMIN Take 1 tablet (1,000 mcg total) by mouth daily.               Durable Medical Equipment  (From admission, onward)           Start     Ordered   08/13/21 1013  For home use only DME Walker rolling  Once       Question Answer Comment  Walker: With [place] Wheels   Patient needs a walker to treat with the following condition Physical deconditioning      08/13/21 1013              Discharge Care Instructions  (From admission, onward)           Start     Ordered   08/13/21 0000  No dressing needed        08/13/21 1036            Follow-up Information     Primary Care MD. Schedule an appointment as soon as possible for a visit in 1 week(s).          [facility] Follow up.   Why: Office will call with date/time, If you dont hear from them,please give them a call Contact information: [address]     [address] [place] [address] [phone]        Lahoma Rocker, MD. Schedule an appointment as soon as possible for a visit in 1 month(s).   Specialty: Rheumatology Contact information: [address] [place] [address] [phone]                No Known Allergies    Consultations:  None   Other Procedures/Studies: DG Knee 1-2 Views Left  Result Date: 08/09/2021 CLINICAL DATA:  Chronic joint pain. EXAM: LEFT KNEE - 1-2 VIEW; LEFT ANKLE - 2 VIEW COMPARISON:  None. FINDINGS: Left knee: There is moderate medial compartment joint  space narrowing and osteophyte formation and mild patellofemoral compartment joint space narrowing and osteophyte formation. Alignment is anatomic. No acute fractures are seen. Bipartite patella noted. Soft tissues are within normal limits. Left ankle: There is no acute fracture or dislocation. There is a well corticated density adjacent to the tip of the medial malleolus likely related to old injury. Joint spaces are maintained. Soft tissues are within normal limits. IMPRESSION: 1. No acute bony abnormality of the left ankle or left knee. 2. Moderate degenerative changes of the left knee. 3. Sequelae from prior injury of the medial malleolus. Electronically Signed   By: Ronney Asters M.D.   On: 08/09/2021 15:42   DG Knee 1-2 Views Right  Result Date: 08/09/2021 CLINICAL DATA:  Chronic bilateral knee and ankle pain for years. No reported recent injury. Inpatient. EXAM: RIGHT KNEE - 1-2 VIEW COMPARISON:  None. FINDINGS: Irregular well corticated 2.6 cm osseous body in the suprapatellar right knee joint. No acute fracture. No joint effusion. No dislocation. No suspicious focal osseous lesions. 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 No joint effusion. No  acute osseous abnormality. Electronically Signed   By: Ilona Sorrel M.D.   On: 08/09/2021 15:42   DG Ankle 2 Views Left  Result Date: 08/09/2021 CLINICAL DATA:  Chronic joint pain. EXAM: LEFT KNEE - 1-2 VIEW; LEFT ANKLE - 2 VIEW COMPARISON:  None. FINDINGS: Left knee: There is moderate medial compartment joint space narrowing and osteophyte formation and mild patellofemoral compartment joint space narrowing and osteophyte formation. Alignment is anatomic. No acute fractures are seen. Bipartite patella noted. Soft tissues are within normal limits. Left ankle: There is no acute fracture or dislocation. There is a well corticated density adjacent to the tip of the medial malleolus likely related to old injury. Joint spaces are maintained. Soft tissues are within normal limits. IMPRESSION: 1. No acute bony abnormality of the left ankle or left knee. 2. Moderate degenerative changes of the left knee. 3. Sequelae from prior injury of the medial malleolus. Electronically Signed   By: Ronney Asters M.D.   On: 08/09/2021 15:42   DG Ankle 2 Views Right  Result Date: 08/09/2021 CLINICAL DATA:  Chronic bilateral ankle and knee pain for years, no known injuries, inpatient EXAM: RIGHT ANKLE - 2 VIEW COMPARISON:  None. FINDINGS: No fracture or subluxation. No the osseous lesions. Vascular calcifications in the anterior right ankle soft tissues. Mild osteoarthritis with small marginal osteophytes in the right ankle joint. No radiopaque foreign bodies. IMPRESSION: Mild right ankle joint osteoarthritis. Electronically Signed   By: Ilona Sorrel M.D.   On: 08/09/2021 15:40   CT Abdomen Pelvis W Contrast  Result Date: 07/30/2021 CLINICAL DATA:  Abdominal pain EXAM: CT ABDOMEN AND PELVIS WITH CONTRAST TECHNIQUE: Multidetector CT imaging of the abdomen and pelvis was performed using the standard protocol following bolus administration of intravenous contrast. CONTRAST:  135m OMNIPAQUE IOHEXOL 300 MG/ML  SOLN COMPARISON:   None. FINDINGS: Lower chest: Lung bases are clear. No effusions. Heart is normal size. Hepatobiliary: No focal hepatic abnormality. Gallbladder unremarkable. Pancreas: No focal abnormality or ductal dilatation. Spleen: No focal abnormality.  Normal size. Adrenals/Urinary Tract: Small nodule with calcification in the right adrenal gland measuring 12 mm. Left adrenal gland and kidneys unremarkable. Urinary bladder unremarkable. No hydronephrosis. Stomach/Bowel: Stomach, large and small bowel grossly unremarkable. Vascular/Lymphatic: Aortic  atherosclerosis. No evidence of aneurysm or adenopathy. Reproductive: No visible focal abnormality. Other: No free fluid or free air. Musculoskeletal: No acute bony abnormality. IMPRESSION: No acute findings in the abdomen or pelvis. Small nonspecific nodule in the right adrenal gland. This could be followed with noncontrast CT abdomen in 6 months to assess stability or further characterized with MRI. Electronically Signed   By: Rolm Baptise M.D.   On: 07/30/2021 22:04   DG Chest Port 1 View  Result Date: 07/30/2021 CLINICAL DATA:  Questionable sepsis EXAM: PORTABLE CHEST 1 VIEW COMPARISON:  09/27/2018 FINDINGS: Heart and mediastinal contours are within normal limits. No focal opacities or effusions. No acute bony abnormality. IMPRESSION: No active disease. Electronically Signed   By: Rolm Baptise M.D.   On: 07/30/2021 17:33   US Abdomen Limited RUQ (LIVER/GB)  Result Date: 07/30/2021 CLINICAL DATA:  Elevated LFTs EXAM: ULTRASOUND ABDOMEN LIMITED RIGHT UPPER QUADRANT COMPARISON:  04/04/2017 abdomen ultrasound FINDINGS: Gallbladder: Gallstones, which measure up to 1.4 cm in size, similar to the prior exam. No wall thickening or pericholecystic fluid. No sonographic Murphy sign noted by sonographer. Common bile duct: Diameter: 4 mm Liver: No focal lesion identified. Within normal limits in parenchymal echogenicity. Portal vein is patent on color Doppler imaging with normal  direction of blood flow towards the liver. Other: None. IMPRESSION: Cholelithiasis without evidence cholecystitis. Otherwise unremarkable right upper quadrant ultrasound. Electronically Signed   By: Merilyn Baba M.D.   On: 07/30/2021 23:16     TODAY-DAY OF DISCHARGE:  Subjective:   Leward Quan today has no headache,no chest abdominal pain,no new weakness tingling or numbness, feels much better wants to go home today.   Objective:   Blood pressure 112/67, pulse 78, temperature 97.6 F (36.4 C), temperature source Oral, resp. rate 18, height 5' 11"  (1.803 m), weight 81.5 kg, SpO2 94 %.  Intake/Output Summary (Last 24 hours) at 08/13/2021 1043 Last data filed at 08/12/2021 2248 Gross per 24 hour  Intake 240 ml  Output 1025 ml  Net -785 ml   Filed Weights   07/30/21 1639 07/31/21 2132  Weight: 81.6 kg 81.5 kg    Exam: Awake Alert, Oriented *3, No new F.N deficits, Normal affect [place].AT,PERRAL Supple Neck,No JVD, No cervical lymphadenopathy appriciated.  Symmetrical Chest wall movement, Good air movement bilaterally, CTAB RRR,No Gallops,Rubs or new Murmurs, No Parasternal Heave +ve B.Sounds, Abd Soft, Non tender, No organomegaly appriciated, No rebound -guarding or rigidity. No Cyanosis, Clubbing or edema, No new Rash or bruise   PERTINENT RADIOLOGIC STUDIES: No results found.   PERTINENT LAB RESULTS: CBC: No results for input(s): WBC, HGB, HCT, PLT in the last 72 hours. CMET CMP     Component Value Date/Time   NA 137 08/09/2021 0136   K 4.0 08/09/2021 0136   CL 102 08/09/2021 0136   CO2 26 08/09/2021 0136   GLUCOSE 109 (H) 08/09/2021 0136   BUN 20 08/09/2021 0136   CREATININE 0.84 08/09/2021 0136   CALCIUM 9.9 08/09/2021 0136   PROT 7.1 08/09/2021 0136   ALBUMIN 2.9 (L) 08/09/2021 0136   AST 51 (H) 08/09/2021 0136   ALT 77 (H) 08/09/2021 0136   ALKPHOS 339 (H) 08/09/2021 0136   BILITOT 1.3 (H) 08/09/2021 0136   GFRNONAA >60 08/09/2021 0136   GFRAA >60  09/27/2018 1218    GFR Estimated Creatinine Clearance: 105.8 mL/min (by C-G formula based on SCr of 0.84 mg/dL). No results for input(s): LIPASE, AMYLASE in the last 72 hours. No results for  input(s): CKTOTAL, CKMB, CKMBINDEX, TROPONINI in the last 72 hours. Invalid input(s): POCBNP No results for input(s): DDIMER in the last 72 hours. No results for input(s): HGBA1C in the last 72 hours. No results for input(s): CHOL, HDL, LDLCALC, TRIG, CHOLHDL, LDLDIRECT in the last 72 hours. No results for input(s): TSH, T4TOTAL, T3FREE, THYROIDAB in the last 72 hours.  Invalid input(s): FREET3 No results for input(s): VITAMINB12, FOLATE, FERRITIN, TIBC, IRON, RETICCTPCT in the last 72 hours. Coags: No results for input(s): INR in the last 72 hours.  Invalid input(s): PT Microbiology: No results found for this or any previous visit (from the past 240 hour(s)).  FURTHER DISCHARGE INSTRUCTIONS:  Get Medicines reviewed and adjusted: Please take all your medications with you for your next visit with your Primary MD  Laboratory/radiological data: Please request your Primary MD to go over all hospital tests and procedure/radiological results at the follow up, please ask your Primary MD to get all Hospital records sent to his/her office.  In some cases, they will be blood work, cultures and biopsy results pending at the time of your discharge. Please request that your primary care M.D. goes through all the records of your hospital data and follows up on these results.  Also Note the following: If you experience worsening of your admission symptoms, develop shortness of breath, life threatening emergency, suicidal or homicidal thoughts you must seek medical attention immediately by calling 911 or calling your MD immediately  if symptoms less severe.  You must read complete instructions/literature along with all the possible adverse reactions/side effects for all the Medicines you take and that have  been prescribed to you. Take any new Medicines after you have completely understood and accpet all the possible adverse reactions/side effects.   Do not drive when taking Pain medications or sleeping medications (Benzodaizepines)  Do not take more than prescribed Pain, Sleep and Anxiety Medications. It is not advisable to combine anxiety,sleep and pain medications without talking with your primary care practitioner  Special Instructions: If you have smoked or chewed Tobacco  in the last 2 yrs please stop smoking, stop any regular Alcohol  and or any Recreational drug use.  Wear Seat belts while driving.  Please note: You were cared for by a hospitalist during your hospital stay. Once you are discharged, your primary care physician will handle any further medical issues. Please note that NO REFILLS for any discharge medications will be authorized once you are discharged, as it is imperative that you return to your primary care physician (or establish a relationship with a primary care physician if you do not have one) for your post hospital discharge needs so that they can reassess your need for medications and monitor your lab values.  Total Time spent coordinating discharge including counseling, education and face to face time equals 35 minutes.  SignedOren Binet 08/13/2021 10:43 AM

## 2021-08-13 NOTE — TOC Transition Note (Addendum)
Transition of Care [facility]) - CM/SW Discharge Note   Patient Details  Name: William [name] MRN: [id number] Date of Birth: [birth year]  [facility] CM/SW Contact:  Harriet Masson, RN Phone Number: 08/13/2021, 12:06 PM   Clinical Narrative:   Patient stable for discharge. Orders for DME walker. Spoke to patient regarding transition needs. Patient agreeable to use in house provider for DME needs.  Dan Humphreys has been delivered to the room. Match letter activated. Medication will be sent to room before discharge. Copays covered by [facility] Address, phone number and PCP apt made and on discharge instructions. Patient states he has transportation to appointments    Final next level of care: Home/Self Care Barriers to Discharge: No Barriers Identified   Patient Goals and CMS Choice Patient states their goals for this hospitalization and ongoing recovery are:: return home      Discharge Placement             Home          Discharge Plan and Services In-house Referral: Clinical Social Work              DME Arranged: Dan Humphreys DME Agency: [employer] Date DME Agency Contacted: 08/13/21 Time DME Agency Contacted: 1206 Representative spoke with at DME Agency: Marianna Fuss            Social Determinants of Health (SDOH) Interventions     Readmission Risk Interventions No flowsheet data found.

## 2021-08-13 NOTE — Progress Notes (Signed)
Physical Therapy Treatment Patient Details Name: William [name] MRN: [id number] DOB: 08-Apr-1966 Today's Date: 08/13/2021   History of Present Illness 55 yo male presenting generalized weakness and bilateral wrist and leg pain. Diagnosed with gout flare; started on colchicine. PMH including EtOH abuse, cholecystitis, liver failure (2018), and gout.    PT Comments    Patient doing much better and demonstrating improved safety with use of RW. Demonstrated abilities necessary for climbing steps to enter home and reports roommates will help him as he does not have a railing. Educated on how they can most safely assist him. MD in during session and plan is for discharge home today.     Recommendations for follow up therapy are one component of a multi-disciplinary discharge planning process, led by the attending physician.  Recommendations may be updated based on patient status, additional functional criteria and insurance authorization.  Follow Up Recommendations  No PT follow up     Assistance Recommended at Discharge Set up Supervision/Assistance  Equipment Recommendations  Rolling walker (2 wheels)    Recommendations for Other Services       Precautions / Restrictions Precautions Precautions: Fall Precaution Comments: painful bilat knees, R wrist and hand     Mobility  Bed Mobility Overal bed mobility: Needs Assistance Bed Mobility: Supine to Sit     Supine to sit: Modified independent (Device/Increase time)     General bed mobility comments: able to get to eob without physical assistance    Transfers Overall transfer level: Needs assistance Equipment used: Rolling walker (2 wheels) Transfers: Sit to/from Stand Sit to Stand: Supervision           General transfer comment: supervision with cues for stand to sit (to take RW all the way with him prior to sitting)    Ambulation/Gait Ambulation/Gait assistance: Modified independent (Device/Increase time) Gait  Distance (Feet): 75 Feet Assistive device: Rolling walker (2 wheels) Gait Pattern/deviations: Step-to pattern;Decreased stride length;Antalgic   Gait velocity interpretation: 1.31 - 2.62 ft/sec, indicative of limited community ambulator General Gait Details: now able to keep bil LEs inside RW, no cues needed for correct use during gait   Stairs Stairs:  (pt able to high march in place to clear feet on steps; pain no longer limiting ability to stand on one leg to allow advancing other leg; discussed having one or two of his roommates assist him up the steps "3 muskateer-style" as he does not have a rail)           Wheelchair Mobility    Modified Rankin (Stroke Patients Only)       Balance Overall balance assessment: Needs assistance Sitting-balance support: No upper extremity supported Sitting balance-Leahy Scale: Fair Sitting balance - Comments: able to maintain balance while on eob   Standing balance support: Bilateral upper extremity supported Standing balance-Leahy Scale: Fair Standing balance comment: no longer reliant on UE support for static standing                            Cognition Arousal/Alertness: Awake/alert Behavior During Therapy: WFL for tasks assessed/performed Overall Cognitive Status: No family/caregiver present to determine baseline cognitive functioning Area of Impairment: Following commands;Safety/judgement                   Current Attention Level: Selective   Following Commands: Follows multi-step commands with increased time;Follows one step commands inconsistently Safety/Judgement: Decreased awareness of safety Awareness: Emergent   General Comments: moves  quickly and not always with ideal, safest use of RW (parking it off to the side before getting to the chair)        Exercises General Exercises - Lower Extremity Hip Flexion/Marching: AROM;Both;10 reps    General Comments General comments (skin integrity, edema,  etc.): MD in during session and discussed dc plan      Pertinent Vitals/Pain Pain Assessment: Faces Faces Pain Scale: Hurts little more Pain Location: BLEs and RUE Pain Descriptors / Indicators: Aching;Guarding Pain Intervention(s): Limited activity within patient's tolerance    Home Living                          Prior Function            PT Goals (current goals can now be found in the care plan section) Acute Rehab PT Goals Patient Stated Goal: Stop pain PT Goal Formulation: With patient Time For Goal Achievement: 08/14/21 Potential to Achieve Goals: Fair Progress towards PT goals: Progressing toward goals (all goals met except stair goal; anticipate based on gait and exercises that he can get into house)    Frequency    Min 3X/week      PT Plan Discharge plan needs to be updated    Co-evaluation              AM-PAC PT "6 Clicks" Mobility   Outcome Measure  Help needed turning from your back to your side while in a flat bed without using bedrails?: None Help needed moving from lying on your back to sitting on the side of a flat bed without using bedrails?: None Help needed moving to and from a bed to a chair (including a wheelchair)?: None Help needed standing up from a chair using your arms (e.g., wheelchair or bedside chair)?: A Little Help needed to walk in hospital room?: A Little Help needed climbing 3-5 steps with a railing? : A Little 6 Click Score: 21    End of Session   Activity Tolerance: Patient tolerated treatment well Patient left: with call bell/phone within reach;in chair (no chair alarm in room; pad under pt; pt reports he will call for assist if needs to get up) Nurse Communication: Mobility status PT Visit Diagnosis: Other abnormalities of gait and mobility (R26.89);Difficulty in walking, not elsewhere classified (R26.2);Muscle weakness (generalized) (M62.81) Pain - Right/Left:  (both) Pain - part of body: Hand;Ankle and  joints of foot     Time: 4627-0350 PT Time Calculation (min) (ACUTE ONLY): 10 min  Charges:  $Gait Training: 8-22 mins                      Arby Barrette, PT Acute Rehabilitation Services  Pager [phone] Office [phone]    Rexanne Mano 08/13/2021, 9:23 AM

## 2021-09-04 ENCOUNTER — Encounter: Payer: Self-pay | Admitting: Internal Medicine

## 2021-09-04 ENCOUNTER — Other Ambulatory Visit: Payer: Self-pay

## 2021-09-04 ENCOUNTER — Ambulatory Visit: Payer: Medicaid Other | Attending: Internal Medicine | Admitting: Internal Medicine

## 2021-09-04 VITALS — BP 103/68 | HR 68 | Resp 16 | Wt 174.2 lb

## 2021-09-04 DIAGNOSIS — E538 Deficiency of other specified B group vitamins: Secondary | ICD-10-CM

## 2021-09-04 DIAGNOSIS — Z8739 Personal history of other diseases of the musculoskeletal system and connective tissue: Secondary | ICD-10-CM

## 2021-09-04 DIAGNOSIS — Z09 Encounter for follow-up examination after completed treatment for conditions other than malignant neoplasm: Secondary | ICD-10-CM

## 2021-09-04 DIAGNOSIS — E46 Unspecified protein-calorie malnutrition: Secondary | ICD-10-CM

## 2021-09-04 DIAGNOSIS — E278 Other specified disorders of adrenal gland: Secondary | ICD-10-CM

## 2021-09-04 DIAGNOSIS — M170 Bilateral primary osteoarthritis of knee: Secondary | ICD-10-CM

## 2021-09-04 DIAGNOSIS — F1011 Alcohol abuse, in remission: Secondary | ICD-10-CM | POA: Diagnosis not present

## 2021-09-04 DIAGNOSIS — K5900 Constipation, unspecified: Secondary | ICD-10-CM

## 2021-09-04 DIAGNOSIS — K7010 Alcoholic hepatitis without ascites: Secondary | ICD-10-CM

## 2021-09-04 DIAGNOSIS — E279 Disorder of adrenal gland, unspecified: Secondary | ICD-10-CM | POA: Insufficient documentation

## 2021-09-04 DIAGNOSIS — Z2821 Immunization not carried out because of patient refusal: Secondary | ICD-10-CM

## 2021-09-04 MED ORDER — POLYETHYLENE GLYCOL 3350 17 GM/SCOOP PO POWD
17.0000 g | Freq: Every day | ORAL | 1 refills | Status: AC
  Filled 2021-09-04: qty 510, 30d supply, fill #0

## 2021-09-04 MED ORDER — GABAPENTIN 400 MG PO CAPS
400.0000 mg | ORAL_CAPSULE | Freq: Three times a day (TID) | ORAL | 2 refills | Status: AC
  Filled 2021-09-04: qty 90, 30d supply, fill #0

## 2021-09-04 MED ORDER — VITAMIN B-12 1000 MCG PO TABS
1000.0000 ug | ORAL_TABLET | Freq: Every day | ORAL | 2 refills | Status: DC
  Filled 2021-09-04: qty 100, 100d supply, fill #0

## 2021-09-04 MED ORDER — COLCHICINE 0.6 MG PO TABS
0.6000 mg | ORAL_TABLET | Freq: Every day | ORAL | 2 refills | Status: DC
Start: 2021-09-04 — End: 2021-10-18
  Filled 2021-09-04: qty 30, 30d supply, fill #0

## 2021-09-04 MED ORDER — PANTOPRAZOLE SODIUM 40 MG PO TBEC
40.0000 mg | DELAYED_RELEASE_TABLET | Freq: Every day | ORAL | 2 refills | Status: DC
  Filled 2021-09-04: qty 30, 30d supply, fill #0
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
## 2021-09-04 NOTE — Patient Instructions (Signed)
You should apply for the orange card/cone discount card as discussed today.

## 2021-09-05 LAB — COMPREHENSIVE METABOLIC PANEL
ALT: 16 IU/L (ref 0–44)
AST: 22 IU/L (ref 0–40)
Albumin/Globulin Ratio: 1.7 (ref 1.2–2.2)
Albumin: 4 g/dL (ref 3.8–4.9)
Alkaline Phosphatase: 165 IU/L — ABNORMAL HIGH (ref 44–121)
BUN/Creatinine Ratio: 16 (ref 9–20)
BUN: 12 mg/dL (ref 6–24)
Bilirubin Total: 0.5 mg/dL (ref 0.0–1.2)
CO2: 25 mmol/L (ref 20–29)
Calcium: 9.8 mg/dL (ref 8.7–10.2)
Chloride: 107 mmol/L — ABNORMAL HIGH (ref 96–106)
Creatinine, Ser: 0.75 mg/dL — ABNORMAL LOW (ref 0.76–1.27)
Globulin, Total: 2.4 g/dL (ref 1.5–4.5)
Glucose: 97 mg/dL (ref 70–99)
Potassium: 4.4 mmol/L (ref 3.5–5.2)
Sodium: 145 mmol/L — ABNORMAL HIGH (ref 134–144)
Total Protein: 6.4 g/dL (ref 6.0–8.5)
eGFR: 107 mL/min/{1.73_m2} (ref >59)

## 2021-09-05 LAB — VITAMIN B12: Vitamin B-12: 390 pg/mL (ref 232–1245)

## 2021-09-05 NOTE — Progress Notes (Signed)
Let patient know that his vitamin B12 level has improved.  Continue taking vitamin B12 supplement daily.  Liver function tests are significantly improved.  Continue to stay away from excessive drinking of alcoholic beverages.

## 2021-10-18 ENCOUNTER — Other Ambulatory Visit: Payer: Self-pay

## 2021-10-18 ENCOUNTER — Other Ambulatory Visit (HOSPITAL_COMMUNITY): Payer: Self-pay

## 2021-10-18 ENCOUNTER — Encounter: Payer: Self-pay | Admitting: Internal Medicine

## 2021-10-18 ENCOUNTER — Ambulatory Visit: Payer: Medicaid Other | Attending: Internal Medicine | Admitting: Internal Medicine

## 2021-10-18 VITALS — BP 116/73 | HR 88 | Resp 16 | Wt 183.0 lb

## 2021-10-18 DIAGNOSIS — E538 Deficiency of other specified B group vitamins: Secondary | ICD-10-CM | POA: Diagnosis not present

## 2021-10-18 DIAGNOSIS — E279 Disorder of adrenal gland, unspecified: Secondary | ICD-10-CM

## 2021-10-18 DIAGNOSIS — F1020 Alcohol dependence, uncomplicated: Secondary | ICD-10-CM | POA: Diagnosis not present

## 2021-10-18 DIAGNOSIS — E46 Unspecified protein-calorie malnutrition: Secondary | ICD-10-CM | POA: Diagnosis not present

## 2021-10-18 DIAGNOSIS — Z8739 Personal history of other diseases of the musculoskeletal system and connective tissue: Secondary | ICD-10-CM

## 2021-10-18 DIAGNOSIS — Z2821 Immunization not carried out because of patient refusal: Secondary | ICD-10-CM

## 2021-10-18 DIAGNOSIS — Z1211 Encounter for screening for malignant neoplasm of colon: Secondary | ICD-10-CM

## 2021-10-18 DIAGNOSIS — E278 Other specified disorders of adrenal gland: Secondary | ICD-10-CM

## 2021-10-18 DIAGNOSIS — M170 Bilateral primary osteoarthritis of knee: Secondary | ICD-10-CM | POA: Diagnosis not present

## 2021-10-18 MED ORDER — COLCHICINE 0.6 MG PO TABS
0.6000 mg | ORAL_TABLET | Freq: Every day | ORAL | 2 refills | Status: AC
  Filled 2021-10-18: qty 30, 30d supply, fill #0

## 2021-10-18 MED ORDER — MELOXICAM 15 MG PO TABS
15.0000 mg | ORAL_TABLET | Freq: Every day | ORAL | 3 refills | Status: AC
  Filled 2021-10-18: qty 30, 30d supply, fill #0

## 2021-10-18 MED ORDER — VITAMIN B-12 1000 MCG PO TABS
1000.0000 ug | ORAL_TABLET | Freq: Every day | ORAL | 2 refills | Status: AC
  Filled 2021-10-18: qty 100, 100d supply, fill #0

## 2021-10-18 MED ORDER — DEXAMETHASONE 1 MG PO TABS
ORAL_TABLET | ORAL | 0 refills | Status: AC
  Filled 2021-10-18: qty 1, 1d supply, fill #0
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
## 2021-10-21 LAB — DHEA-SULFATE: DHEA-SO4: 90.1 ug/dL (ref 48.9–344.2)

## 2021-10-21 LAB — HEPATIC FUNCTION PANEL
ALT: 19 IU/L (ref 0–44)
AST: 30 IU/L (ref 0–40)
Albumin: 4.2 g/dL (ref 3.8–4.9)
Alkaline Phosphatase: 122 IU/L — ABNORMAL HIGH (ref 44–121)
Bilirubin Total: 0.7 mg/dL (ref 0.0–1.2)
Bilirubin, Direct: 0.21 mg/dL (ref 0.00–0.40)
Total Protein: 6.4 g/dL (ref 6.0–8.5)

## 2021-10-21 LAB — ALDOSTERONE: ALDOSTERONE: <1 ng/dL (ref 0.0–30.0)

## 2021-10-21 LAB — METANEPHRINES, PLASMA
Metanephrine, Free: <10 pg/mL (ref 0.0–88.0)
Normetanephrine, Free: 53.8 pg/mL (ref 0.0–244.0)

## 2021-10-23 ENCOUNTER — Telehealth: Payer: Self-pay

## 2021-10-23 NOTE — Telephone Encounter (Signed)
Contacted pt to go over lab results pt didn't answer land was unable to lvm due to line kept ringing   Sent a CRM and forward labs to NT to give pt labs when they call back

## 2021-10-26 ENCOUNTER — Other Ambulatory Visit (HOSPITAL_COMMUNITY): Payer: Self-pay

## 2022-02-15 ENCOUNTER — Ambulatory Visit: Payer: Self-pay | Admitting: Internal Medicine

## 2022-08-16 ENCOUNTER — Ambulatory Visit: Payer: Medicaid Other | Admitting: Internal Medicine

## 2023-06-08 ENCOUNTER — Emergency Department (HOSPITAL_COMMUNITY): Payer: Medicaid Other

## 2023-06-08 ENCOUNTER — Other Ambulatory Visit: Payer: Self-pay

## 2023-06-08 ENCOUNTER — Emergency Department (HOSPITAL_COMMUNITY)
Admission: EM | Admit: 2023-06-08 | Discharge: 2023-06-08 | Disposition: A | Discharge status: Home / Self Care | Payer: Medicaid Other | Source: Home / Self Care | Attending: Emergency Medicine | Admitting: Emergency Medicine

## 2023-06-08 ENCOUNTER — Encounter (HOSPITAL_COMMUNITY): Payer: Self-pay | Admitting: Emergency Medicine

## 2023-06-08 DIAGNOSIS — Y9301 Activity, walking, marching and hiking: Secondary | ICD-10-CM | POA: Diagnosis not present

## 2023-06-08 DIAGNOSIS — S0121XA Laceration without foreign body of nose, initial encounter: Secondary | ICD-10-CM | POA: Insufficient documentation

## 2023-06-08 DIAGNOSIS — S0990XA Unspecified injury of head, initial encounter: Secondary | ICD-10-CM | POA: Insufficient documentation

## 2023-06-08 DIAGNOSIS — M79674 Pain in right toe(s): Secondary | ICD-10-CM | POA: Insufficient documentation

## 2023-06-08 DIAGNOSIS — W01198A Fall on same level from slipping, tripping and stumbling with subsequent striking against other object, initial encounter: Secondary | ICD-10-CM | POA: Insufficient documentation

## 2023-06-08 DIAGNOSIS — F10920 Alcohol use, unspecified with intoxication, uncomplicated: Secondary | ICD-10-CM | POA: Insufficient documentation

## 2023-06-08 DIAGNOSIS — Y906 Blood alcohol level of 120-199 mg/100 ml: Secondary | ICD-10-CM | POA: Insufficient documentation

## 2023-06-08 DIAGNOSIS — L03031 Cellulitis of right toe: Secondary | ICD-10-CM

## 2023-06-08 DIAGNOSIS — S0992XA Unspecified injury of nose, initial encounter: Secondary | ICD-10-CM | POA: Diagnosis present

## 2023-06-08 LAB — CBC WITH DIFFERENTIAL/PLATELET
Abs Immature Granulocytes: 0.01 10*3/uL (ref 0.00–0.07)
Basophils Absolute: 0 10*3/uL (ref 0.0–0.1)
Basophils Relative: 1 %
Eosinophils Absolute: 0.3 10*3/uL (ref 0.0–0.5)
Eosinophils Relative: 5 %
HCT: 43.8 % (ref 39.0–52.0)
Hemoglobin: 14.6 g/dL (ref 13.0–17.0)
Immature Granulocytes: 0 %
Lymphocytes Relative: 34 %
Lymphs Abs: 1.9 10*3/uL (ref 0.7–4.0)
MCH: 32 pg (ref 26.0–34.0)
MCHC: 33.3 g/dL (ref 30.0–36.0)
MCV: 96.1 fL (ref 80.0–100.0)
Monocytes Absolute: 0.5 10*3/uL (ref 0.1–1.0)
Monocytes Relative: 8 %
Neutro Abs: 3 10*3/uL (ref 1.7–7.7)
Neutrophils Relative %: 52 %
Platelets: 268 10*3/uL (ref 150–400)
RBC: 4.56 MIL/uL (ref 4.22–5.81)
RDW: 13.8 % (ref 11.5–15.5)
WBC: 5.7 10*3/uL (ref 4.0–10.5)
nRBC: 0 % (ref 0.0–0.2)

## 2023-06-08 LAB — BASIC METABOLIC PANEL
Anion gap: 11 (ref 5–15)
BUN: 7 mg/dL (ref 6–20)
CO2: 20 mmol/L — ABNORMAL LOW (ref 22–32)
Calcium: 8.8 mg/dL — ABNORMAL LOW (ref 8.9–10.3)
Chloride: 98 mmol/L (ref 98–111)
Creatinine, Ser: 0.68 mg/dL (ref 0.61–1.24)
GFR, Estimated: >60 mL/min (ref >60)
Glucose, Bld: 117 mg/dL — ABNORMAL HIGH (ref 70–99)
Potassium: 3.6 mmol/L (ref 3.5–5.1)
Sodium: 129 mmol/L — ABNORMAL LOW (ref 135–145)

## 2023-06-08 LAB — SALICYLATE LEVEL: Salicylate Lvl: <7 mg/dL — ABNORMAL LOW (ref 7.0–30.0)

## 2023-06-08 LAB — ACETAMINOPHEN LEVEL: Acetaminophen (Tylenol), Serum: 13 ug/mL (ref 10–30)

## 2023-06-08 LAB — ETHANOL: Alcohol, Ethyl (B): 180 mg/dL — ABNORMAL HIGH (ref <10)

## 2023-06-08 MED ORDER — TRANEXAMIC ACID FOR EPISTAXIS
500.0000 mg | Freq: Once | TOPICAL | Status: AC
  Administered 2023-06-08: 500 mg via TOPICAL
  Filled 2023-06-08 (×2): qty 10

## 2023-06-08 MED ORDER — DOXYCYCLINE HYCLATE 100 MG PO CAPS: 100.0000 mg | ORAL_CAPSULE | Freq: Two times a day (BID) | ORAL | 0 refills | Status: AC

## 2023-06-08 MED ORDER — LIDOCAINE HCL (PF) 1 % IJ SOLN
5.0000 mL | Freq: Once | INTRAMUSCULAR | Status: AC
  Administered 2023-06-08: 5 mL
  Filled 2023-06-08: qty 30

## 2023-06-08 NOTE — ED Provider Notes (Signed)
[place] EMERGENCY DEPARTMENT AT [facility] Provider Note   CSN: [id number] Arrival date & time: 06/08/23  6440     History  Chief Complaint  Patient presents with   Alcohol Intoxication   Facial Injury    TAESHON MACKE is a 57 y.o. male.  Patient presents to the emergency department complaining of a facial injury secondary to a fall.  Patient states he was consuming alcohol this evening and lost his balance, falling forward and hitting his face on concrete.  There is a laceration noted to the bridge of the nose.  Patient also complains of pain to the second toe of the right foot.  He denies any injury to the area and states it has been ongoing for a few weeks.  Patient denies fever, nausea, vomiting, blood thinner use, loss of consciousness, abdominal pain, chest pain, shortness of breath.  Past medical history significant for alcoholism, gout, elevated LFTs  HPI     Home Medications Prior to Admission medications   Medication Sig Start Date End Date Taking? Authorizing Provider  colchicine 0.6 MG tablet Take 1 tablet (0.6 mg total) by mouth daily. 10/18/21   Marcine Matar, MD  dexamethasone (DECADRON) 1 MG tablet Take 1 tablet by mouth at 11 p.m then come to lab next morning at 8:30 a.m to have Cortisol level checked 10/18/21   Marcine Matar, MD  diclofenac Sodium (VOLTAREN) 1 % GEL Apply 4 g topically 4 (four) times daily. 08/13/21   Ghimire, Werner Lean, MD  gabapentin (NEURONTIN) 400 MG capsule Take 1 capsule (400 mg total) by mouth 3 (three) times daily. 09/04/21 12/03/21  Marcine Matar, MD  meloxicam (MOBIC) 15 MG tablet Take 1 tablet (15 mg total) by mouth daily. 10/18/21   Marcine Matar, MD  pantoprazole (PROTONIX) 40 MG tablet Take 1 tablet (40 mg total) by mouth daily. 09/04/21   Marcine Matar, MD  polyethylene glycol powder (GLYCOLAX/MIRALAX) 17 GM/SCOOP powder Take 17 g by mouth daily. For constipation 09/04/21   Marcine Matar, MD   vitamin B-12 (CYANOCOBALAMIN) 1000 MCG tablet Take 1 tablet (1,000 mcg total) by mouth daily. 10/18/21   Marcine Matar, MD      Allergies    Patient has no known allergies.    Review of Systems   Review of Systems  Physical Exam Updated Vital Signs BP 112/78   Pulse 74   Temp (!) 96.4 F (35.8 C) (Axillary)   Resp 18   SpO2 95%  Physical Exam Vitals and nursing note reviewed.  Constitutional:      General: He is not in acute distress.    Appearance: He is well-developed.  HENT:     Head: Normocephalic.     Nose:     Right Nostril: No septal hematoma.     Left Nostril: No septal hematoma.   Eyes:     Conjunctiva/sclera: Conjunctivae normal.  Cardiovascular:     Rate and Rhythm: Normal rate and regular rhythm.     Heart sounds: No murmur heard. Pulmonary:     Effort: Pulmonary effort is normal. No respiratory distress.     Breath sounds: Normal breath sounds.  Abdominal:     Palpations: Abdomen is soft.     Tenderness: There is no abdominal tenderness.  Musculoskeletal:        General: Swelling and tenderness present. No deformity or signs of injury.     Cervical back: Neck supple.  Comments: Second toe of right foot with brisk cap refill, no deformity, mildly swollen and tender  Skin:    General: Skin is warm and dry.     Capillary Refill: Capillary refill takes less than 2 seconds.     Findings: Erythema (Second toe of right foot with erythema) present.  Neurological:     General: No focal deficit present.     Mental Status: He is alert and oriented to person, place, and time.     Sensory: No sensory deficit.     Motor: No weakness.  Psychiatric:        Mood and Affect: Mood normal.     ED Results / Procedures / Treatments   Labs (all labs ordered are listed, but only abnormal results are displayed) Labs Reviewed  BASIC METABOLIC PANEL - Abnormal; Notable for the following components:      Result Value   Sodium 129 (*)    CO2 20 (*)    Glucose,  Bld 117 (*)    Calcium 8.8 (*)    All other components within normal limits  ETHANOL - Abnormal; Notable for the following components:   Alcohol, Ethyl (B) 180 (*)    All other components within normal limits  SALICYLATE LEVEL - Abnormal; Notable for the following components:   Salicylate Lvl <7.0 (*)    All other components within normal limits  CBC WITH DIFFERENTIAL/PLATELET  ACETAMINOPHEN LEVEL    EKG None  Radiology CT Head Wo Contrast  Result Date: 06/08/2023 CLINICAL DATA:  Larey Seat forward on the uneven concrete, facial lacerations, orbital trauma EXAM: CT HEAD WITHOUT CONTRAST CT MAXILLOFACIAL WITHOUT CONTRAST TECHNIQUE: Multidetector CT imaging of the head and maxillofacial structures were performed using the standard protocol without intravenous contrast. Multiplanar CT image reconstructions of the maxillofacial structures were also generated. RADIATION DOSE REDUCTION: This exam was performed according to the departmental dose-optimization program which includes automated exposure control, adjustment of the mA and/or kV according to patient size and/or use of iterative reconstruction technique. COMPARISON:  09/27/2018 CT maxillofacial, 11/10/2017 CT head and maxillofacial FINDINGS: CT HEAD FINDINGS Brain: No acute infarct, hemorrhage, mass, mass effect, or midline shift. No hydrocephalus or extra-axial collection. Advanced cerebral volume loss for age, with ex vacuo dilatation of the ventricles. Periventricular white matter changes, likely the sequela of chronic small vessel ischemic disease. Vascular: No hyperdense vessel. Skull: Negative for fracture or focal lesion. Other: None. CT MAXILLOFACIAL FINDINGS Osseous: No fracture or mandibular dislocation. No destructive process. Unchanged contour of the nasal bones compared to 2019. Orbits: No acute traumatic or inflammatory finding. Redemonstrated chronic right lamina papyracea fracture. Status post bilateral lens replacements. Sinuses:  Unchanged complete opacification of the right frontal sinus, with osseous thickening, compatible with chronic sinusitis. Additional mucosal thickening in the ethmoid air cells and maxillary sinuses. The mastoids are well aerated. Soft tissues: Laceration to the right aspect of the nose. IMPRESSION: 1. No acute intracranial process. 2. Laceration to the right aspect of the nose. No acute facial bone fracture. Electronically Signed   By: Wiliam Ke M.D.   On: 06/08/2023 03:10   CT Maxillofacial Wo Contrast  Result Date: 06/08/2023 CLINICAL DATA:  Larey Seat forward on the uneven concrete, facial lacerations, orbital trauma EXAM: CT HEAD WITHOUT CONTRAST CT MAXILLOFACIAL WITHOUT CONTRAST TECHNIQUE: Multidetector CT imaging of the head and maxillofacial structures were performed using the standard protocol without intravenous contrast. Multiplanar CT image reconstructions of the maxillofacial structures were also generated. RADIATION DOSE REDUCTION: This exam was performed  according to the departmental dose-optimization program which includes automated exposure control, adjustment of the mA and/or kV according to patient size and/or use of iterative reconstruction technique. COMPARISON:  09/27/2018 CT maxillofacial, 11/10/2017 CT head and maxillofacial FINDINGS: CT HEAD FINDINGS Brain: No acute infarct, hemorrhage, mass, mass effect, or midline shift. No hydrocephalus or extra-axial collection. Advanced cerebral volume loss for age, with ex vacuo dilatation of the ventricles. Periventricular white matter changes, likely the sequela of chronic small vessel ischemic disease. Vascular: No hyperdense vessel. Skull: Negative for fracture or focal lesion. Other: None. CT MAXILLOFACIAL FINDINGS Osseous: No fracture or mandibular dislocation. No destructive process. Unchanged contour of the nasal bones compared to 2019. Orbits: No acute traumatic or inflammatory finding. Redemonstrated chronic right lamina papyracea fracture.  Status post bilateral lens replacements. Sinuses: Unchanged complete opacification of the right frontal sinus, with osseous thickening, compatible with chronic sinusitis. Additional mucosal thickening in the ethmoid air cells and maxillary sinuses. The mastoids are well aerated. Soft tissues: Laceration to the right aspect of the nose. IMPRESSION: 1. No acute intracranial process. 2. Laceration to the right aspect of the nose. No acute facial bone fracture. Electronically Signed   By: Wiliam Ke M.D.   On: 06/08/2023 03:10   DG Foot Complete Right  Result Date: 06/08/2023 CLINICAL DATA:  Second toe pain and swelling. EXAM: RIGHT FOOT COMPLETE - 3+ VIEW COMPARISON:  None Available. FINDINGS: There is mild swelling in the second toe. There may be an ulceration of the anterior tip of the toe, but no evidence of adjacent acute osteomyelitis. There is a chronic healed fracture deformity of the great toe proximal phalanx. There is no evidence of acute fracture. The bone mineralization is normal. There are mild features of midfoot arthrosis. There are calcifications in the anterior tibial artery and dorsalis pedis. There is a chronic medial malleolar fracture with nonunion. Mild soft tissue fullness in the hindfoot. IMPRESSION: 1. Soft tissue swelling in the second toe with possible ulceration. No evidence of underlying acute osteomyelitis. 2. Chronic healed fracture deformity of the great toe proximal phalanx. 3. Vascular calcifications. Electronically Signed   By: Almira Bar M.D.   On: 06/08/2023 03:00    Procedures .Marland KitchenLaceration Repair  Date/Time: 06/08/2023 4:39 AM  Performed by: Darrick Grinder, PA-C Authorized by: Darrick Grinder, PA-C   Consent:    Consent obtained:  Verbal   Consent given by:  Patient   Risks discussed:  Infection, nerve damage, need for additional repair, pain, poor cosmetic result and poor wound healing Universal protocol:    Patient identity confirmed:  Verbally with  patient Anesthesia:    Anesthesia method:  Local infiltration   Local anesthetic:  Lidocaine 1% w/o epi Laceration details:    Location:  Face   Face location:  Nose   Length (cm):  2.5 Pre-procedure details:    Preparation:  Patient was prepped and draped in usual sterile fashion Exploration:    Hemostasis obtained with: TXA. Treatment:    Area cleansed with:  Saline   Amount of cleaning:  Standard Skin repair:    Repair method:  Sutures   Suture size:  5-0   Wound skin closure material used: Vicryl.   Suture technique:  Simple interrupted   Number of sutures:  3 Approximation:    Approximation:  Close Repair type:    Repair type:  Intermediate Post-procedure details:    Dressing:  Open (no dressing)   Procedure completion:  Tolerated well, no immediate complications .Marland KitchenLaceration  Repair  Date/Time: 06/08/2023 4:40 AM  Performed by: Darrick Grinder, PA-C Authorized by: Darrick Grinder, PA-C   Consent:    Consent obtained:  Verbal   Consent given by:  Patient   Risks discussed:  Pain, infection, need for additional repair, poor cosmetic result, poor wound healing and nerve damage Universal protocol:    Patient identity confirmed:  Verbally with patient Anesthesia:    Anesthesia method:  Local infiltration   Local anesthetic:  Lidocaine 1% w/o epi Laceration details:    Location:  Face   Face location:  Nose   Length (cm):  0.5 Exploration:    Hemostasis obtained with: TXA. Treatment:    Area cleansed with:  Saline   Amount of cleaning:  Standard Skin repair:    Repair method:  Sutures   Suture size:  5-0   Wound skin closure material used: Vicryl.   Suture technique:  Simple interrupted   Number of sutures:  1 Approximation:    Approximation:  Close Repair type:    Repair type:  Simple Post-procedure details:    Procedure completion:  Tolerated well, no immediate complications     Medications Ordered in ED Medications  lidocaine (PF) (XYLOCAINE)  1 % injection 5 mL (5 mLs Infiltration Given by Other 06/08/23 0317)  tranexamic acid (CYKLOKAPRON) 1000 MG/10ML topical solution 500 mg (500 mg Topical Given 06/08/23 0415)    ED Course/ Medical Decision Making/ A&P                                 Medical Decision Making Amount and/or Complexity of Data Reviewed Labs: ordered. Radiology: ordered.  Risk Prescription drug management.   This patient presents to the ED for concern of head injury and toe pain, this involves an extensive number of treatment options, and is a complaint that carries with it a high risk of complications and morbidity.  The differential diagnosis includes intracranial abnormality, facial fractures, soft tissue injury, others.  The differential for the toe pain includes cellulitis, osteomyelitis, fracture, dislocation, others   Co morbidities that complicate the patient evaluation  Alcoholism   Additional history obtained:  Additional history obtained from EMS   Lab Tests:  I Ordered, and personally interpreted labs.  The pertinent results include: Ethanol 180, sodium 129   Imaging Studies ordered:  I ordered imaging studies including CT head without contrast, CT maxillofacial without contrast, plain films of the right foot I independently visualized and interpreted imaging which showed  1. No acute intracranial process.  2. Laceration to the right aspect of the nose. No acute facial bone  fracture.   1. Soft tissue swelling in the second toe with possible ulceration.  No evidence of underlying acute osteomyelitis.  2. Chronic healed fracture deformity of the great toe proximal  phalanx.  3. Vascular calcifications.   I agree with the radiologist interpretation   Problem List / ED Course / Critical interventions / Medication management   I ordered medication including lidocaine for local anesthetic, TXA for hemorrhage control Reevaluation of the patient after these medicines showed that  the patient improved I have reviewed the patients home medicines and have made adjustments as needed   Social Determinants of Health:  Patient has Medicaid for his primary health insurance type   Test / Admission - Considered:  Patient with no acute intracranial abnormality, soft tissue injury to the nose.  Nasal injury was repaired with  absorbable sutures.  If desired patient may have these removed in 7 days.  The toe complaint seems consistent with a mild cellulitis.  Plan to discharge home with prescription for doxycycline.  Patient has follow-up with primary care on Tuesday and may discuss the toe further at that time.  No signs of osteomyelitis on imaging.  No open ulcer noted upon exam.  Patient able to ambulate without difficulty, not clinically intoxicated at this time.  Discharge home at this time         Final Clinical Impression(s) / ED Diagnoses Final diagnoses:  None    Rx / DC Orders ED Discharge Orders     None         Montavion, Rascoe 06/08/23 0559    Shon Baton, MD 06/08/23 [phone]

## 2023-06-08 NOTE — Discharge Instructions (Addendum)
You were evaluated today after a fall.  Your imaging showed no fracture or intracranial abnormality.  Your nose was repaired with a total of 4 absorbable sutures. The redness of your toe appears to be a skin infection.  I have prescribed an antibiotic to be taken as directed.  Please keep your appointment this week with your primary care provider for further evaluation as needed.

## 2023-06-08 NOTE — ED Triage Notes (Signed)
Patient coming to ED for evaluation of facial injury.  Report +ETOH use tonight.  Tripped while walking on "uneven concrete."  Lyons face forward.  Has deep laceration to nose.  Bleeding controlled at this time.  Reported nose bleed after fall.

## 2023-09-03 IMAGING — CR DG KNEE 1-2V*R*
2 series · 2 of 2 positions shown · non-contrast
Comparison: None.

CLINICAL DATA: Chronic bilateral knee and ankle pain for years. No
reported recent injury. Inpatient.

EXAM:
RIGHT KNEE - 1-2 VIEW

[knee ap]
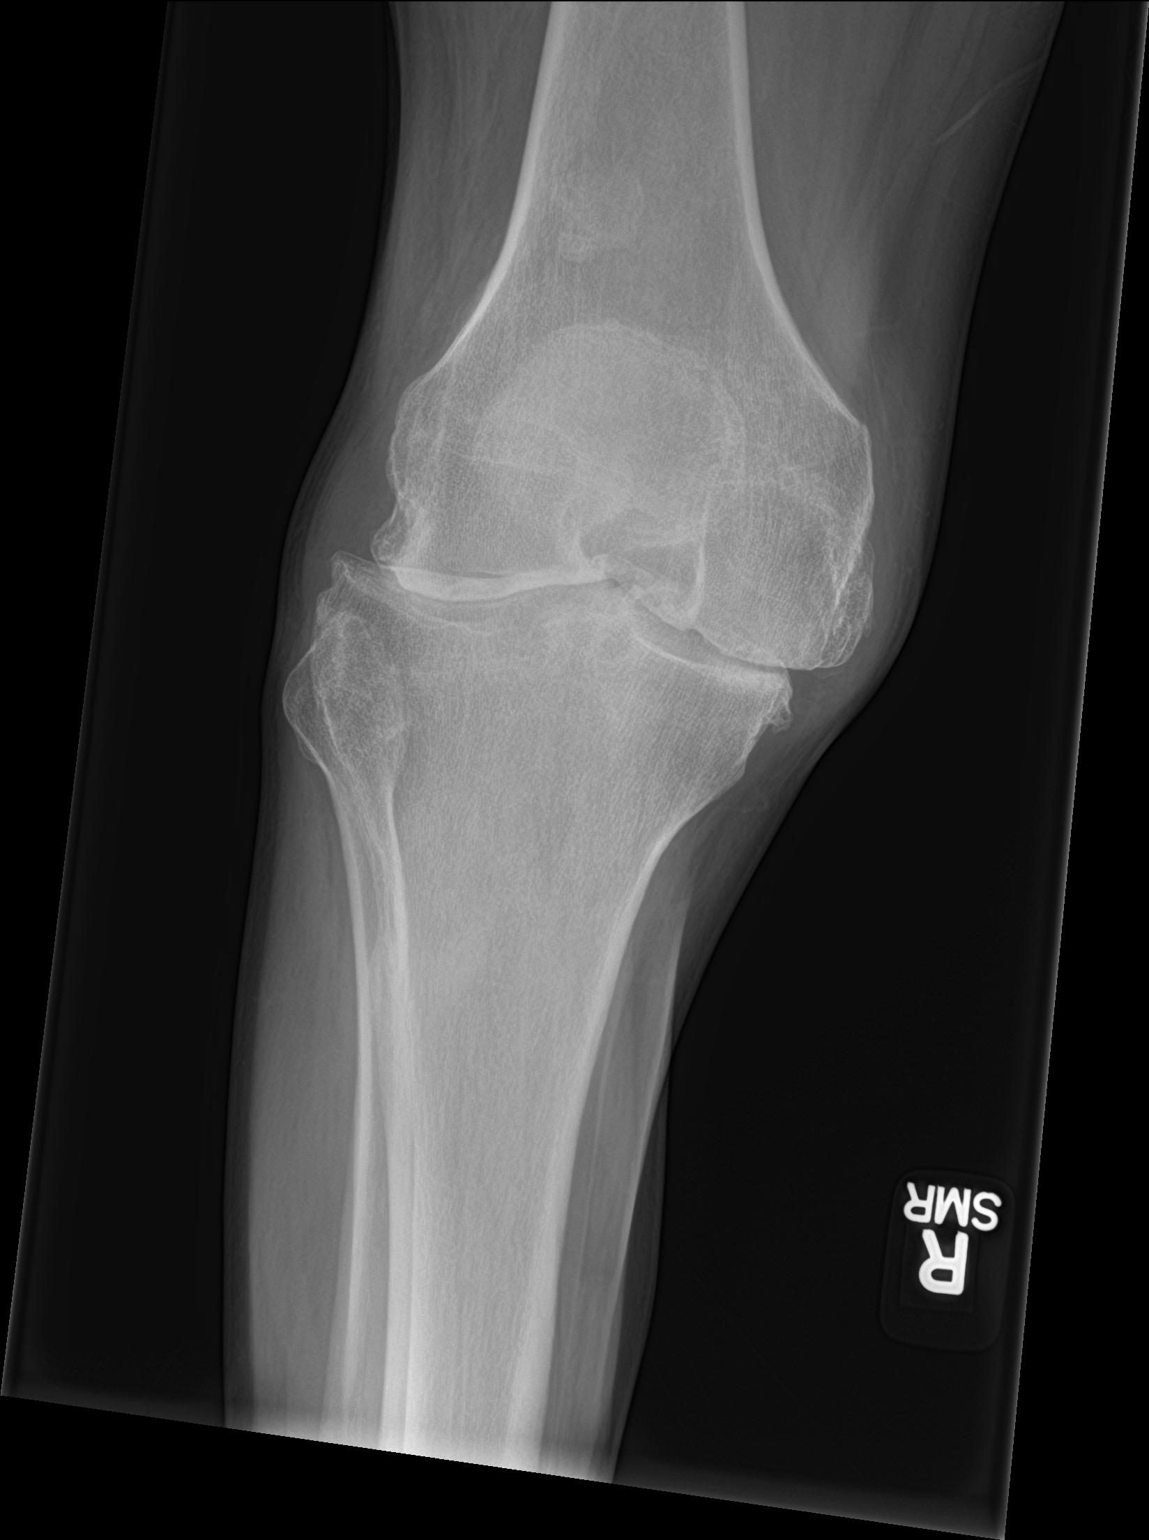

[knee lat]
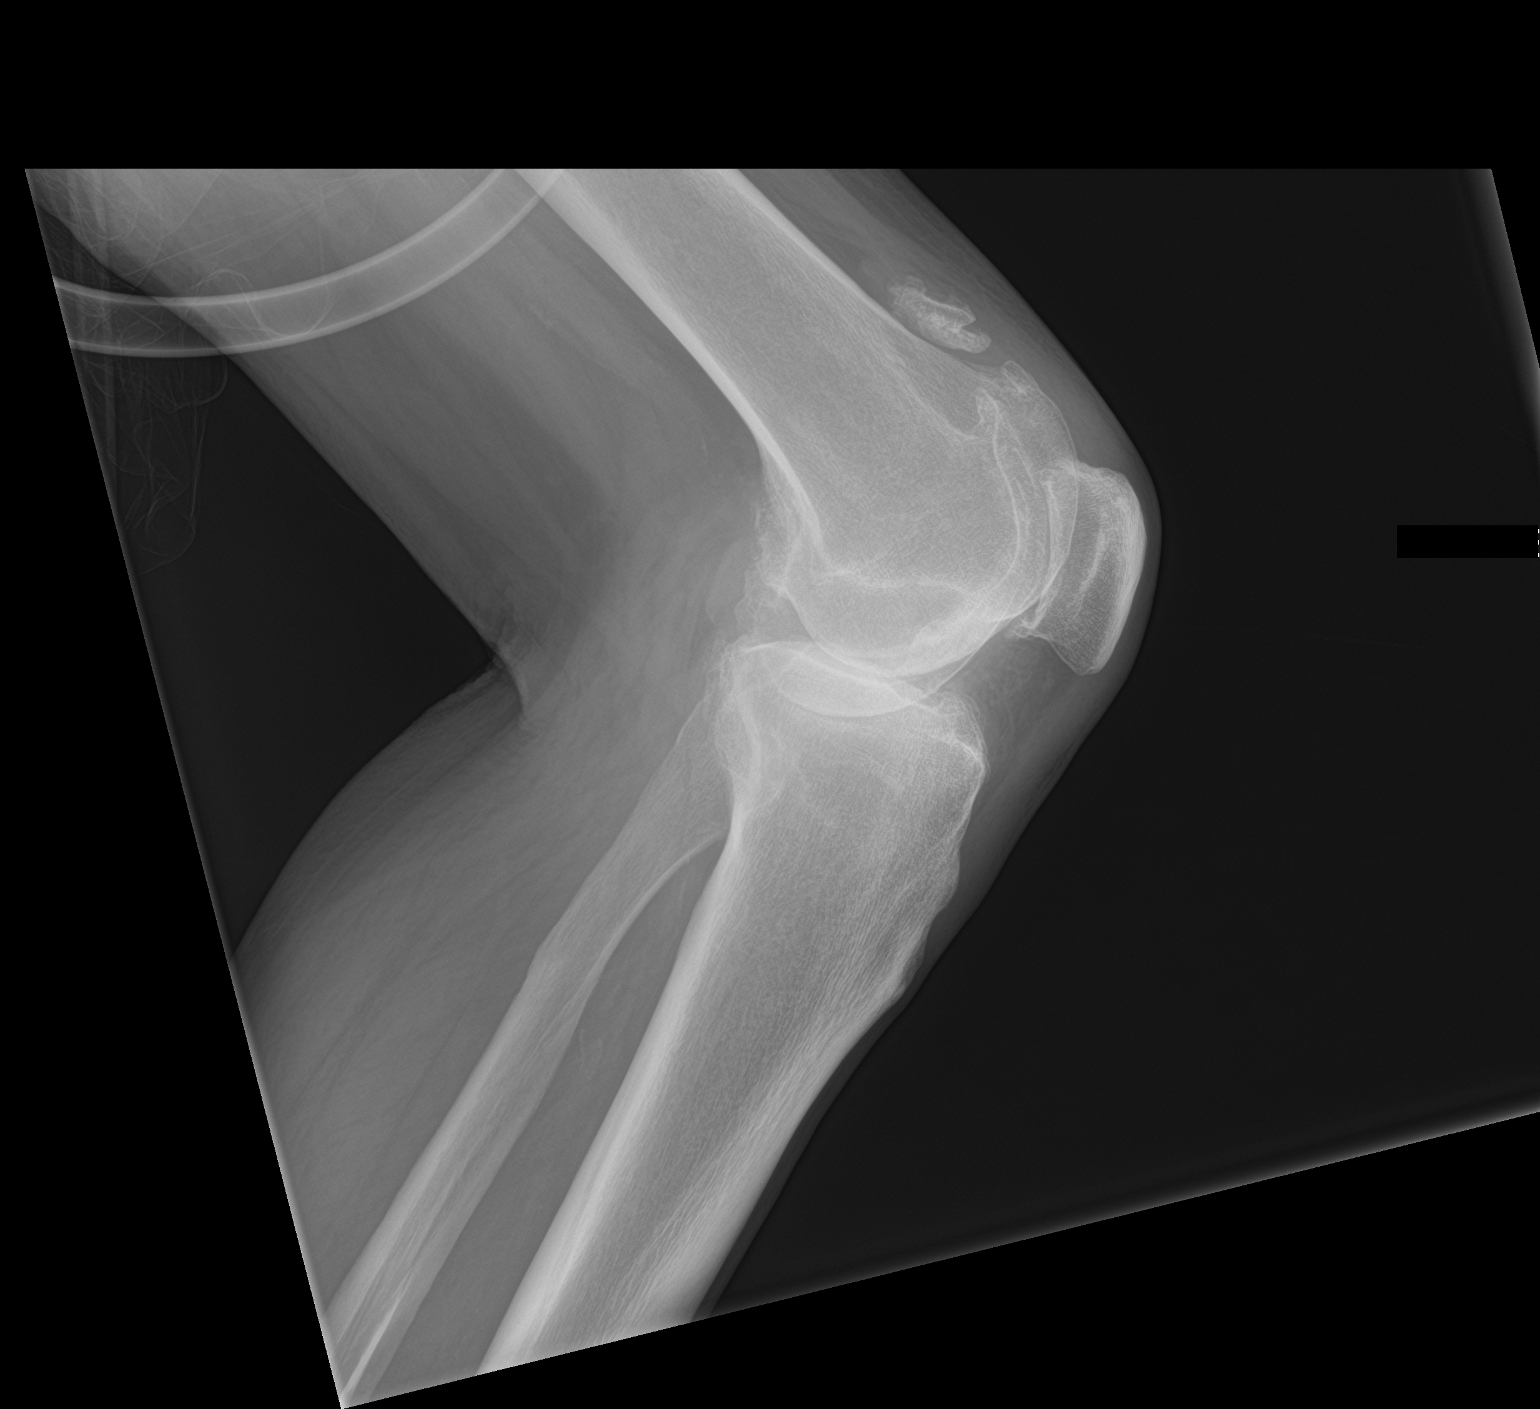

[2 of 2 positions shown; findings below may reference images not displayed]

FINDINGS: Irregular well corticated 2.6 cm osseous body in the suprapatellar
right knee joint. No acute fracture. No joint effusion. No
dislocation. No suspicious focal osseous lesions. Moderate to severe
tricompartmental right knee osteoarthritis.
IMPRESSION: Irregular well corticated 2.6 cm loose intra-articular osseous body
in the suprapatellar right knee joint, probably secondary
osteochondromatosis. Moderate to severe tricompartmental right knee
osteoarthritis. No joint effusion. No acute osseous abnormality.

## 2023-09-03 IMAGING — CR DG ANKLE 2V *L*
2 series · 2 of 2 positions shown · non-contrast
Comparison: None.

CLINICAL DATA: Chronic joint pain.

EXAM:
LEFT KNEE - 1-2 VIEW; LEFT ANKLE - 2 VIEW

[ankle ap]
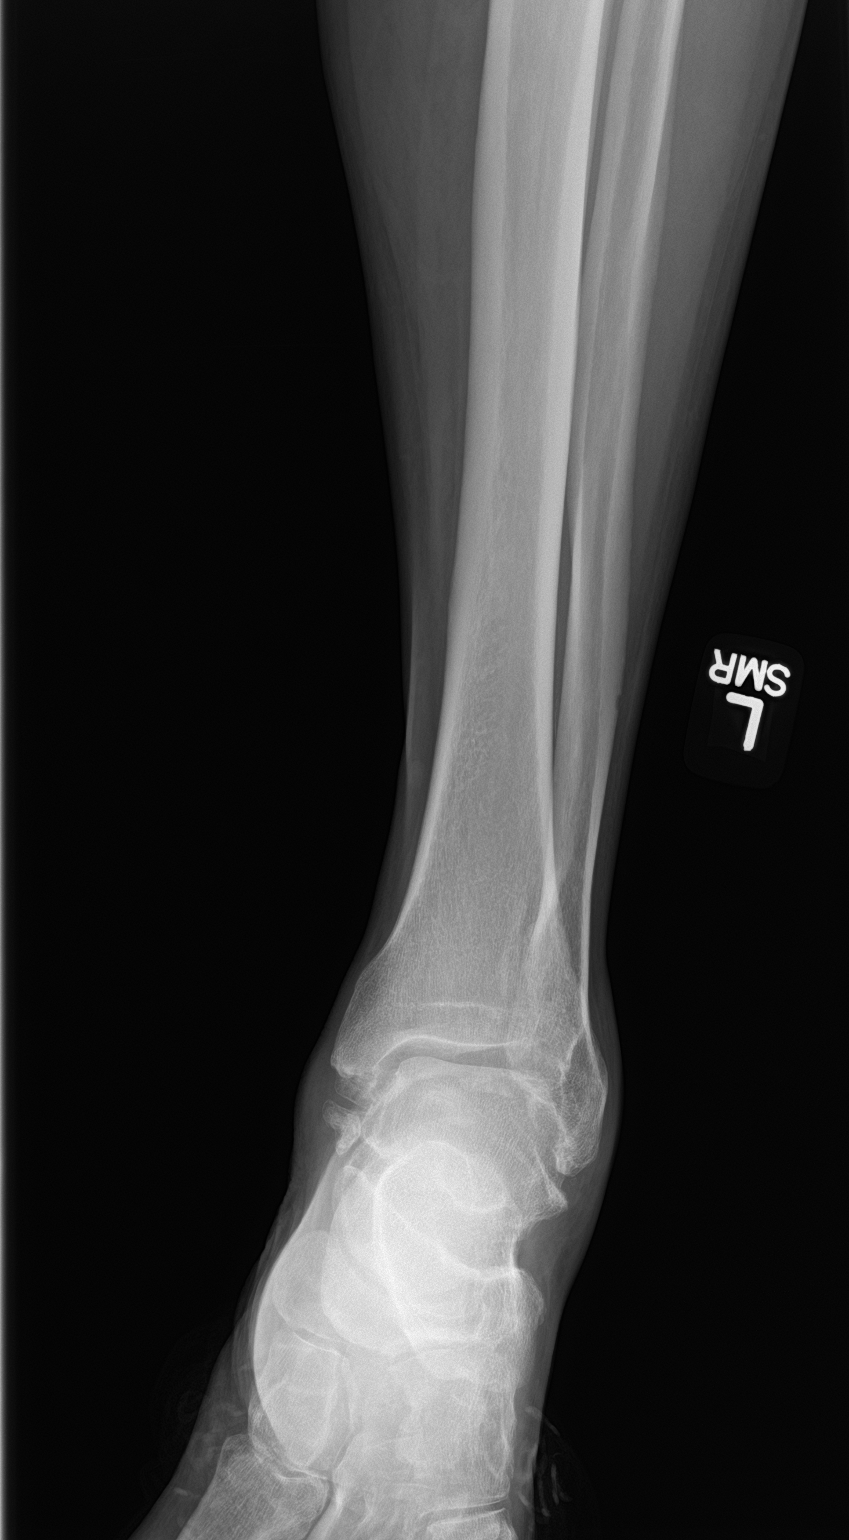

[ankle lat]
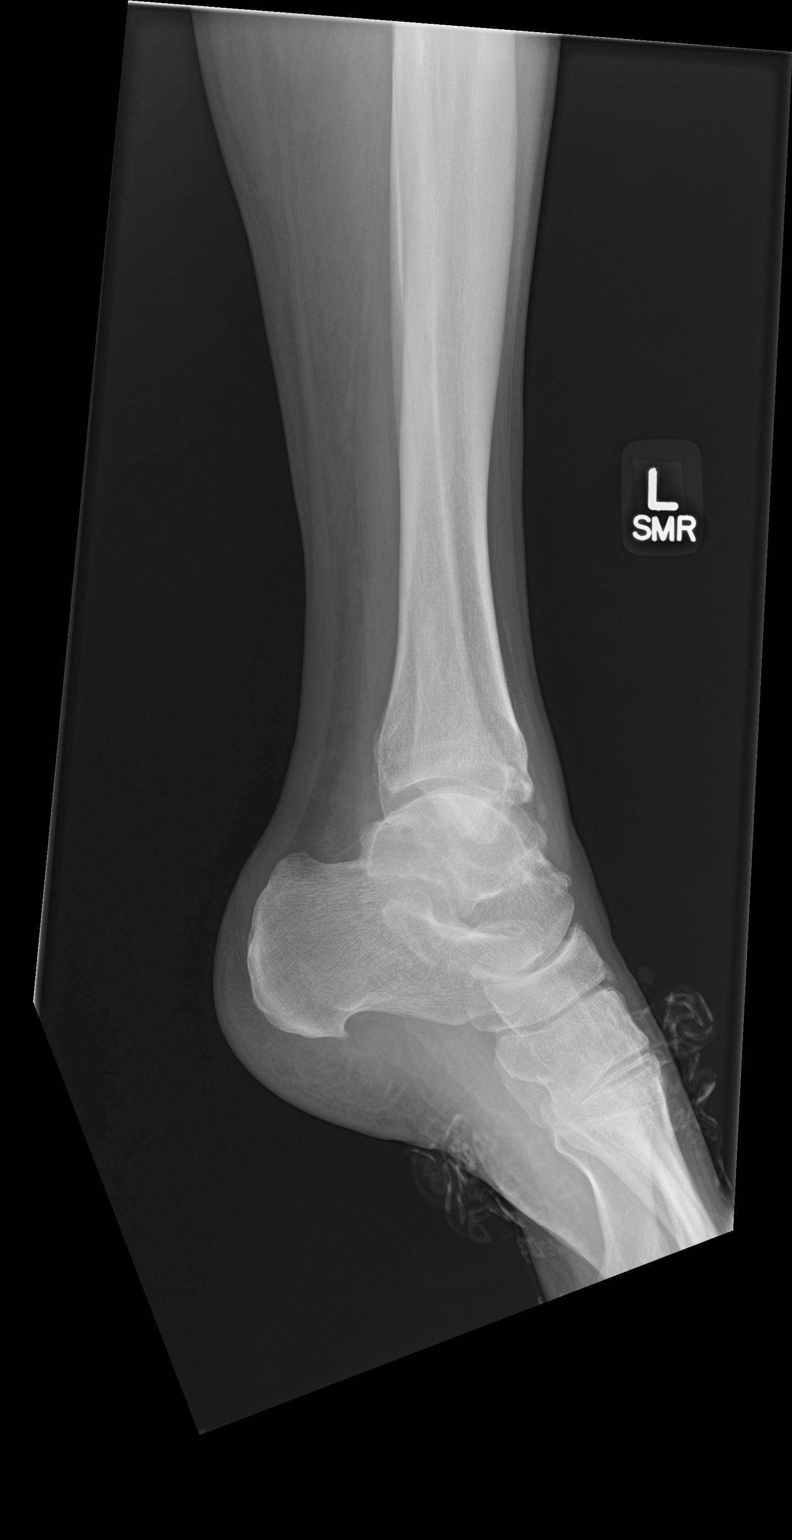

[2 of 2 positions shown; findings below may reference images not displayed]

FINDINGS: Left knee:

There is moderate medial compartment joint space narrowing and
osteophyte formation and mild patellofemoral compartment joint space
narrowing and osteophyte formation. Alignment is anatomic. No acute
fractures are seen. Bipartite patella noted. Soft tissues are within
normal limits.

Left ankle:

There is no acute fracture or dislocation. There is a well
corticated density adjacent to the tip of the medial malleolus
likely related to old injury. Joint spaces are maintained. Soft
tissues are within normal limits.
IMPRESSION: 1. No acute bony abnormality of the left ankle or left knee.
2. Moderate degenerative changes of the left knee.
3. Sequelae from prior injury of the medial malleolus.

## 2023-11-20 ENCOUNTER — Encounter (HOSPITAL_COMMUNITY): Payer: Self-pay | Admitting: Emergency Medicine

## 2023-11-20 ENCOUNTER — Emergency Department (HOSPITAL_COMMUNITY): Payer: Medicaid Other

## 2023-11-20 ENCOUNTER — Emergency Department (HOSPITAL_COMMUNITY)
Admission: EM | Admit: 2023-11-20 | Discharge: 2023-11-21 | Disposition: A | Discharge status: Home / Self Care | Payer: Medicaid Other | Attending: Emergency Medicine | Admitting: Emergency Medicine

## 2023-11-20 DIAGNOSIS — R7989 Other specified abnormal findings of blood chemistry: Secondary | ICD-10-CM

## 2023-11-20 DIAGNOSIS — R531 Weakness: Secondary | ICD-10-CM | POA: Insufficient documentation

## 2023-11-20 DIAGNOSIS — R945 Abnormal results of liver function studies: Secondary | ICD-10-CM | POA: Insufficient documentation

## 2023-11-20 DIAGNOSIS — S0990XA Unspecified injury of head, initial encounter: Secondary | ICD-10-CM | POA: Insufficient documentation

## 2023-11-20 DIAGNOSIS — K8020 Calculus of gallbladder without cholecystitis without obstruction: Secondary | ICD-10-CM | POA: Insufficient documentation

## 2023-11-20 DIAGNOSIS — M25521 Pain in right elbow: Secondary | ICD-10-CM | POA: Insufficient documentation

## 2023-11-20 DIAGNOSIS — R197 Diarrhea, unspecified: Secondary | ICD-10-CM | POA: Insufficient documentation

## 2023-11-20 DIAGNOSIS — I10 Essential (primary) hypertension: Secondary | ICD-10-CM | POA: Insufficient documentation

## 2023-11-20 DIAGNOSIS — S299XXA Unspecified injury of thorax, initial encounter: Secondary | ICD-10-CM | POA: Insufficient documentation

## 2023-11-20 DIAGNOSIS — R1110 Vomiting, unspecified: Secondary | ICD-10-CM

## 2023-11-20 DIAGNOSIS — Z20822 Contact with and (suspected) exposure to covid-19: Secondary | ICD-10-CM | POA: Diagnosis not present

## 2023-11-20 DIAGNOSIS — M130 Polyarthritis, unspecified: Secondary | ICD-10-CM | POA: Diagnosis not present

## 2023-11-20 DIAGNOSIS — M79631 Pain in right forearm: Secondary | ICD-10-CM | POA: Diagnosis not present

## 2023-11-20 DIAGNOSIS — M2550 Pain in unspecified joint: Secondary | ICD-10-CM

## 2023-11-20 DIAGNOSIS — M25511 Pain in right shoulder: Secondary | ICD-10-CM | POA: Insufficient documentation

## 2023-11-20 DIAGNOSIS — W19XXXA Unspecified fall, initial encounter: Secondary | ICD-10-CM | POA: Diagnosis not present

## 2023-11-20 LAB — CBC
HCT: 50.8 % (ref 39.0–52.0)
Hemoglobin: 16.5 g/dL (ref 13.0–17.0)
MCH: 31.3 pg (ref 26.0–34.0)
MCHC: 32.5 g/dL (ref 30.0–36.0)
MCV: 96.2 fL (ref 80.0–100.0)
Platelets: 272 10*3/uL (ref 150–400)
RBC: 5.28 MIL/uL (ref 4.22–5.81)
RDW: 15.8 % — ABNORMAL HIGH (ref 11.5–15.5)
WBC: 6.9 10*3/uL (ref 4.0–10.5)
nRBC: 0 % (ref 0.0–0.2)

## 2023-11-20 LAB — RESP PANEL BY RT-PCR (RSV, FLU A&B, COVID)  RVPGX2
Influenza A by PCR: NEGATIVE
Influenza B by PCR: NEGATIVE
Resp Syncytial Virus by PCR: NEGATIVE
SARS Coronavirus 2 by RT PCR: NEGATIVE

## 2023-11-20 LAB — ETHANOL: Alcohol, Ethyl (B): <10 mg/dL (ref <10)

## 2023-11-20 LAB — URINALYSIS, ROUTINE W REFLEX MICROSCOPIC
Glucose, UA: NEGATIVE mg/dL
Hgb urine dipstick: NEGATIVE
Ketones, ur: NEGATIVE mg/dL
Leukocytes,Ua: NEGATIVE
Nitrite: NEGATIVE
Protein, ur: NEGATIVE mg/dL
Specific Gravity, Urine: 1.026 (ref 1.005–1.030)
pH: 5 (ref 5.0–8.0)

## 2023-11-20 LAB — COMPREHENSIVE METABOLIC PANEL
ALT: 81 U/L — ABNORMAL HIGH (ref 0–44)
AST: 69 U/L — ABNORMAL HIGH (ref 15–41)
Albumin: 3.7 g/dL (ref 3.5–5.0)
Alkaline Phosphatase: 142 U/L — ABNORMAL HIGH (ref 38–126)
Anion gap: 12 (ref 5–15)
BUN: 20 mg/dL (ref 6–20)
CO2: 23 mmol/L (ref 22–32)
Calcium: 10.1 mg/dL (ref 8.9–10.3)
Chloride: 104 mmol/L (ref 98–111)
Creatinine, Ser: 1.07 mg/dL (ref 0.61–1.24)
GFR, Estimated: >60 mL/min (ref >60)
Glucose, Bld: 108 mg/dL — ABNORMAL HIGH (ref 70–99)
Potassium: 3.6 mmol/L (ref 3.5–5.1)
Sodium: 139 mmol/L (ref 135–145)
Total Bilirubin: 3.2 mg/dL — ABNORMAL HIGH (ref 0.0–1.2)
Total Protein: 8.4 g/dL — ABNORMAL HIGH (ref 6.5–8.1)

## 2023-11-20 LAB — SEDIMENTATION RATE: Sed Rate: 34 mm/h — ABNORMAL HIGH (ref 0–16)

## 2023-11-20 LAB — URIC ACID: Uric Acid, Serum: 9 mg/dL — ABNORMAL HIGH (ref 3.7–8.6)

## 2023-11-20 LAB — C-REACTIVE PROTEIN: CRP: 19.7 mg/dL — ABNORMAL HIGH (ref <1.0)

## 2023-11-20 NOTE — ED Triage Notes (Signed)
 Pt BIB [facility] with reports of abdominal pain, n/v/d, fever, and swollen left wrist x 3 days.

## 2023-11-20 NOTE — ED Provider Triage Note (Signed)
 Emergency Medicine Provider Triage Evaluation Note  William [name] , a 58 y.o. male  was evaluated in triage.  Pt complains of of right wrist and elbow pain, abdominal pain, nausea, vomiting, diarrhea symptoms worse of the last 3 to 4 days history of alcoholism and states last drink was over a week ago but he feels like he is not in withdrawal.  Does have known history of gout.  Has been taking colchicine  at home for the last 3 days without improvement..  Review of Systems  Positive: Wrist pain, elbow pain, abdominal pain, nausea, vomiting, diarrhea Negative: Chest pain, shortness of breath  Physical Exam  BP 114/87 (BP Location: Right Arm)   Pulse (!) 113   Temp 98.6 F (37 C) (Axillary)   Resp 18   SpO2 100%  Gen:   Awake, no distress   Resp:  Normal effort  MSK:   Moves extremities without difficulty, tender swollen right wrist Other:  none  Medical Decision Making  Medically screening exam initiated at 5:41 PM.  Appropriate orders placed.  Ubaldo JONETTA Cancer was informed that the remainder of the evaluation will be completed by another provider, this initial triage assessment does not replace that evaluation, and the importance of remaining in the ED until their evaluation is complete.     Albertina Dixon, MD 11/20/23 [phone]

## 2023-11-21 ENCOUNTER — Emergency Department (HOSPITAL_COMMUNITY): Payer: Medicaid Other

## 2023-11-21 ENCOUNTER — Other Ambulatory Visit (HOSPITAL_COMMUNITY): Payer: Self-pay

## 2023-11-21 MED ORDER — CELECOXIB 200 MG PO CAPS
200.0000 mg | ORAL_CAPSULE | Freq: Two times a day (BID) | ORAL | 0 refills | Status: AC
  Filled 2023-11-21 (×2): qty 30, 15d supply, fill #0

## 2023-11-21 MED ORDER — SODIUM CHLORIDE 0.9 % IV BOLUS
1000.0000 mL | Freq: Once | INTRAVENOUS | Status: AC
  Administered 2023-11-21: 1000 mL via INTRAVENOUS

## 2023-11-21 MED ORDER — KETOROLAC TROMETHAMINE 15 MG/ML IJ SOLN
15.0000 mg | Freq: Once | INTRAMUSCULAR | Status: AC
  Administered 2023-11-21: 15 mg via INTRAVENOUS
  Filled 2023-11-21: qty 1

## 2023-11-21 MED ORDER — IOHEXOL 350 MG/ML SOLN
75.0000 mL | Freq: Once | INTRAVENOUS | Status: AC | PRN
  Administered 2023-11-21: 75 mL via INTRAVENOUS

## 2023-11-21 MED ORDER — PANTOPRAZOLE SODIUM 40 MG PO TBEC
40.0000 mg | DELAYED_RELEASE_TABLET | Freq: Every day | ORAL | 0 refills | Status: AC
  Filled 2023-11-21 (×2): qty 14, 14d supply, fill #0

## 2023-11-21 MED ORDER — LIDOCAINE HCL (PF) 1 % IJ SOLN
5.0000 mL | Freq: Once | INTRAMUSCULAR | Status: AC
  Administered 2023-11-21: 5 mL
  Filled 2023-11-21: qty 5

## 2023-11-21 MED ORDER — ONDANSETRON 4 MG PO TBDP
4.0000 mg | ORAL_TABLET | Freq: Three times a day (TID) | ORAL | 0 refills | Status: AC | PRN
  Filled 2023-11-21 (×2): qty 12, 4d supply, fill #0

## 2023-11-21 NOTE — Discharge Instructions (Signed)
 Please follow-up closely with your family doctor for recheck.  Get rechecked sooner in the emergency department if symptoms are worsening or you have new concerning symptoms.

## 2023-11-21 NOTE — ED Provider Notes (Addendum)
 [facility]GENC[facility] DEPARTMENT AT [facility] Provider Note   CSN: [id number] Arrival date & time: 11/20/23  1725     History  Chief Complaint  Patient presents with   Weakness    William [name] is a 58 y.o. male.  The history is provided by the patient and medical records.  Weakness William [name] is a 58 y.o. male who presents to the Emergency Department complaining of sick.  He presents to the emergency department for evaluation of what he describes as being sick.  He states symptoms started potentially on Tuesday with 2 episodes of vomiting daily as well as numerous episodes of diarrhea.  He has associated cold chills but has not checked his temperature.  No cough.  He complains of right-sided abdominal pain.  He also complains of pain throughout his entire right upper extremity for 1 week.  He complains of pain to the right humerus, elbow, forearm.  He states that he has not been able to move his fingers for 1 week.  No reported falls but he does not know if he fell.  He has a history of hypertension and gout.  He drinks alcohol about twice weekly but has not had any alcohol for 1 to 2 weeks.  No tobacco, street drugs.     Home Medications Prior to Admission medications   Medication Sig Start Date End Date Taking? Authorizing Provider  celecoxib  (CELEBREX ) 200 MG capsule Take 1 capsule (200 mg total) by mouth 2 (two) times daily with a meal. 11/21/23  Yes Griselda Norris, MD  ondansetron  (ZOFRAN -ODT) 4 MG disintegrating tablet Take 1 tablet (4 mg total) by mouth every 8 (eight) hours as needed. 11/21/23  Yes Griselda Norris, MD  pantoprazole  (PROTONIX ) 40 MG tablet Take 1 tablet (40 mg total) by mouth daily. 11/21/23  Yes Griselda Norris, MD  colchicine  0.6 MG tablet Take 1 tablet (0.6 mg total) by mouth daily. 10/18/21   Vicci Barnie NOVAK, MD  dexamethasone  (DECADRON ) 1 MG tablet Take 1 tablet by mouth at 11 p.m then come to lab next morning at 8:30 a.m to have Cortisol level  checked 10/18/21   Vicci Barnie NOVAK, MD  diclofenac  Sodium (VOLTAREN ) 1 % GEL Apply 4 g topically 4 (four) times daily. 08/13/21   Ghimire, Donalda HERO, MD  doxycycline  (VIBRAMYCIN ) 100 MG capsule Take 1 capsule (100 mg total) by mouth 2 (two) times daily. 06/08/23   Logan Ubaldo NOVAK, PA-C  gabapentin  (NEURONTIN ) 400 MG capsule Take 1 capsule (400 mg total) by mouth 3 (three) times daily. 09/04/21 12/03/21  Vicci Barnie NOVAK, MD  meloxicam  (MOBIC ) 15 MG tablet Take 1 tablet (15 mg total) by mouth daily. 10/18/21   Vicci Barnie NOVAK, MD  polyethylene glycol powder (GLYCOLAX /MIRALAX ) 17 GM/SCOOP powder Take 17 g by mouth daily. For constipation 09/04/21   Vicci Barnie NOVAK, MD  vitamin B-12 (CYANOCOBALAMIN ) 1000 MCG tablet Take 1 tablet (1,000 mcg total) by mouth daily. 10/18/21   Vicci Barnie NOVAK, MD      Allergies    Patient has no known allergies.    Review of Systems   Review of Systems  Neurological:  Positive for weakness.  All other systems reviewed and are negative.   Physical Exam Updated Vital Signs BP 110/76 (BP Location: Right Arm)   Pulse 75   Temp (!) 97.5 F (36.4 C) (Axillary)   Resp 16   SpO2 99%  Physical Exam Vitals and nursing note reviewed.  Constitutional:  Appearance: He is well-developed.  HENT:     Head: Normocephalic and atraumatic.  Cardiovascular:     Rate and Rhythm: Normal rate and regular rhythm.     Heart sounds: No murmur heard. Pulmonary:     Effort: Pulmonary effort is normal. No respiratory distress.     Breath sounds: Normal breath sounds.  Abdominal:     Palpations: Abdomen is soft.     Tenderness: There is no abdominal tenderness. There is no guarding or rebound.  Musculoskeletal:     Cervical back: Neck supple.     Comments: Soft tissue swelling and tenderness over the right olecranon bursa.  Elbow is fixed in flexion.  Digits are partially flexed - cannot fully flex or extend. 2+ radial pulses bilaterally  Skin:    General: Skin is  warm and dry.  Neurological:     Mental Status: He is alert and oriented to person, place, and time.     Comments: 5 out of 5 strength in bilateral proximal upper extremities, bilateral lower extremities  Psychiatric:        Behavior: Behavior normal.     ED Results / Procedures / Treatments   Labs (all labs ordered are listed, but only abnormal results are displayed) Labs Reviewed  CBC - Abnormal; Notable for the following components:      Result Value   RDW 15.8 (*)    All other components within normal limits  URINALYSIS, ROUTINE W REFLEX MICROSCOPIC - Abnormal; Notable for the following components:   Color, Urine AMBER (*)    APPearance HAZY (*)    Bilirubin Urine SMALL (*)    All other components within normal limits  COMPREHENSIVE METABOLIC PANEL - Abnormal; Notable for the following components:   Glucose, Bld 108 (*)    Total Protein 8.4 (*)    AST 69 (*)    ALT 81 (*)    Alkaline Phosphatase 142 (*)    Total Bilirubin 3.2 (*)    All other components within normal limits  SEDIMENTATION RATE - Abnormal; Notable for the following components:   Sed Rate 34 (*)    All other components within normal limits  URIC ACID - Abnormal; Notable for the following components:   Uric Acid, Serum 9.0 (*)    All other components within normal limits  C-REACTIVE PROTEIN - Abnormal; Notable for the following components:   CRP 19.7 (*)    All other components within normal limits  RESP PANEL BY RT-PCR (RSV, FLU A&B, COVID)  RVPGX2  ETHANOL    EKG EKG Interpretation Date/Time:  Thursday November 20 2023 17:37:50 EST Ventricular Rate:  112 PR Interval:  166 QRS Duration:  86 QT Interval:  312 QTC Calculation: 425 R Axis:   15  Text Interpretation: Sinus tachycardia Otherwise normal ECG Confirmed by Griselda Norris [phone]) on 11/20/2023 11:38:34 PM  Radiology US  Abdomen Limited RUQ (LIVER/GB) Result Date: 11/21/2023 CLINICAL DATA:  Right upper quadrant pain. EXAM: ULTRASOUND  ABDOMEN LIMITED RIGHT UPPER QUADRANT COMPARISON:  July 30, 2021 FINDINGS: Gallbladder: Shadowing echogenic calculi are seen within the gallbladder lumen. The largest measures approximately 1.3 cm. There is no evidence of gallbladder wall thickening (1.7 mm). No sonographic Murphy sign noted by sonographer. Common bile duct: Diameter: 3.4 mm Liver: No focal lesion identified. Diffusely increased echogenicity of the liver parenchyma is noted. Portal vein is patent on color Doppler imaging with normal direction of blood flow towards the liver. Other: None. IMPRESSION: 1. Cholelithiasis without evidence of acute  cholecystitis. 2. Hepatic steatosis. Electronically Signed   By: Suzen Dials M.D.   On: 11/21/2023 04:07   CT CHEST ABDOMEN PELVIS W CONTRAST Result Date: 11/21/2023 CLINICAL DATA:  Status post trauma. EXAM: CT CHEST, ABDOMEN, AND PELVIS WITH CONTRAST TECHNIQUE: Multidetector CT imaging of the chest, abdomen and pelvis was performed following the standard protocol during bolus administration of intravenous contrast. RADIATION DOSE REDUCTION: This exam was performed according to the departmental dose-optimization program which includes automated exposure control, adjustment of the mA and/or kV according to patient size and/or use of iterative reconstruction technique. CONTRAST:  75mL OMNIPAQUE  IOHEXOL  350 MG/ML SOLN COMPARISON:  July 30, 2021 FINDINGS: CT CHEST FINDINGS Cardiovascular: No significant vascular findings. Normal heart size. No pericardial effusion. Mediastinum/Nodes: No enlarged mediastinal, hilar, or axillary lymph nodes. Thyroid gland, trachea, and esophagus demonstrate no significant findings. Lungs/Pleura: Mild atelectasis is seen within the right middle lobe and posterior aspect of the bilateral lower lobes. There is no evidence of an acute infiltrate, pleural effusion or pneumothorax. Musculoskeletal: No chest wall mass or suspicious bone lesions identified. CT ABDOMEN PELVIS  FINDINGS Hepatobiliary: No focal liver abnormality is seen. No gallstones, gallbladder wall thickening, or biliary dilatation. Pancreas: Unremarkable. No pancreatic ductal dilatation or surrounding inflammatory changes. Spleen: Several stable subcentimeter foci of low attenuation are seen scattered throughout the splenic parenchyma (the largest measures approximately 11 mm). Adrenals/Urinary Tract: There is a predominantly stable 12 mm partially calcified right adrenal mass. A stable 13 mm low-attenuation left adrenal mass is seen. Kidneys are normal, without renal calculi, focal lesion, or hydronephrosis. Bladder is unremarkable. Stomach/Bowel: Stomach is within normal limits. Appendix appears normal. No evidence of bowel wall thickening, distention, or inflammatory changes. Vascular/Lymphatic: Aortic atherosclerosis. Area of intraluminal low attenuation is seen within the lumen of the inferior vena cava. This extends from the right atrium to the liver and may represent a mixture of opacified and unopacified blood. Similar appearing findings are noted within the portal vein. No enlarged abdominal or pelvic lymph nodes. Reproductive: Prostate is unremarkable. Other: A 1.9 cm x 2.2 cm fat containing left inguinal hernia is noted. No abdominopelvic ascites. Musculoskeletal: No acute or significant osseous findings. IMPRESSION: 1. No evidence of acute traumatic injury within the chest, abdomen or pelvis. 2. 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 3. Small fat containing left inguinal hernia. 4. Aortic atherosclerosis. Aortic Atherosclerosis (ICD10-I70.0). Electronically Signed   By: Suzen Dials M.D.   On: 11/21/2023 02:52   CT Cervical Spine Wo Contrast Result Date: 11/21/2023 CLINICAL DATA:  Status post trauma. EXAM: CT CERVICAL SPINE WITHOUT CONTRAST TECHNIQUE: Multidetector CT imaging of the cervical spine was performed without intravenous contrast. Multiplanar CT image  reconstructions were also generated. RADIATION DOSE REDUCTION: This exam was performed according to the departmental dose-optimization program which includes automated exposure control, adjustment of the mA and/or kV according to patient size and/or use of iterative reconstruction technique. COMPARISON:  None Available. FINDINGS: Alignment: Normal. Skull base and vertebrae: No acute fracture. No primary bone lesion or focal pathologic process. Soft tissues and spinal canal: No prevertebral fluid or swelling. No visible canal hematoma. Disc levels: Mild multilevel endplate sclerosis is seen throughout the cervical spine with mild to moderate severity anterior osteophyte formation seen at the levels of C3-C4, C5-C6 and C6-C7. There is mild multilevel intervertebral disc space narrowing. Marked severity bilateral facet joint hypertrophy is noted. Upper chest: Negative. Other: An extensive amount of low attenuation is seen throughout the bilateral external auditory canals (  approximately 78.23 Hounsfield units). IMPRESSION: 1. No acute fracture or subluxation in the cervical spine. 2. Mild to moderate severity multilevel degenerative changes. 3. Extensive amount of low attenuation throughout the bilateral external auditory canals, which may represent cerumen. Correlation with direct visualization is recommended. Electronically Signed   By: Suzen Dials M.D.   On: 11/21/2023 02:38   CT Head Wo Contrast Result Date: 11/21/2023 CLINICAL DATA:  Status post trauma. EXAM: CT HEAD WITHOUT CONTRAST TECHNIQUE: Contiguous axial images were obtained from the base of the skull through the vertex without intravenous contrast. RADIATION DOSE REDUCTION: This exam was performed according to the departmental dose-optimization program which includes automated exposure control, adjustment of the mA and/or kV according to patient size and/or use of iterative reconstruction technique. COMPARISON:  June 08, 2023 FINDINGS: Brain:  There is mild cerebral atrophy with widening of the extra-axial spaces and ventricular dilatation. This is greater than expected for the patient's age. There are areas of decreased attenuation within the white matter tracts of the supratentorial brain, consistent with microvascular disease changes. Vascular: No hyperdense vessel or unexpected calcification. Skull: Normal. Negative for fracture or focal lesion. Sinuses/Orbits: A chronic appearing deformity is seen involving the medial wall of the right orbit. There is mild sphenoid sinus mucosal thickening. Other: None. IMPRESSION: 1. No acute intracranial abnormality. 2. Age advanced generalized cerebral atrophy with chronic white matter small vessel ischemic changes. 3. Chronic appearing deformity involving the medial wall of the right orbit. 4. Mild sphenoid sinus disease. Electronically Signed   By: Suzen Dials M.D.   On: 11/21/2023 02:35   DG Elbow Complete Right Result Date: 11/21/2023 CLINICAL DATA:  Status post fall. EXAM: RIGHT ELBOW - COMPLETE 3+ VIEW COMPARISON:  None Available. FINDINGS: There is no evidence of acute fracture. Mild chronic and/or degenerative changes are seen involving the coronoid process and proximal tip of the olecranon process. A chronic deformity of the lateral humeral condyle is noted with an additional small chronic appearing deformity involving the right radial head. Soft tissues are unremarkable. IMPRESSION: Chronic and/or degenerative changes without evidence of acute fracture. Electronically Signed   By: Suzen Dials M.D.   On: 11/21/2023 01:16   DG Chest 2 View Result Date: 11/21/2023 CLINICAL DATA:  Status post fall. EXAM: CHEST - 2 VIEW COMPARISON:  July 30, 2021 FINDINGS: The heart size and mediastinal contours are within normal limits. Low lung volumes are noted with mild linear atelectasis and/or scarring seen within the right lung base. There is no evidence of a pleural effusion or pneumothorax. The  visualized skeletal structures are unremarkable. IMPRESSION: Low lung volumes with mild right basilar linear atelectasis and/or scarring. Electronically Signed   By: Suzen Dials M.D.   On: 11/21/2023 01:10   DG Hand Complete Right Result Date: 11/20/2023 CLINICAL DATA:  Pain EXAM: RIGHT HAND - COMPLETE 3+ VIEW COMPARISON:  None Available. FINDINGS: No acute fracture is seen. Volar flexion deformity at the fifth PIP joint with mild subluxation of the base of the middle phalanx with respect to the head of the proximal phalanx. Chronic appearing deformity at the head of the proximal phalanx and associated moderate arthritis. Mild first CMC and MCP joint degenerative changes. Soft tissues are unremarkable IMPRESSION: 1. No acute fracture. 2. Chronic appearing deformity at the fifth PIP joint. Correlate with history and physical exam. Electronically Signed   By: Luke Bun M.D.   On: 11/20/2023 19:20   DG Forearm Right Result Date: 11/20/2023 CLINICAL DATA:  Fall hand  and arm pain for 3-4 days EXAM: RIGHT FOREARM - 2 VIEW COMPARISON:  None Available. FINDINGS: There is no evidence of fracture or other focal bone lesions. Soft tissues are unremarkable. IMPRESSION: Negative. Electronically Signed   By: Luke Bun M.D.   On: 11/20/2023 19:17    Procedures .Joint Aspiration/Arthrocentesis  Date/Time: 11/21/2023 5:52 AM  Performed by: Griselda Norris, MD Authorized by: Griselda Norris, MD   Consent:    Consent obtained:  Verbal   Consent given by:  Patient   Risks discussed:  Bleeding, infection, pain and incomplete drainage Universal protocol:    Patient identity confirmed:  Verbally with patient Location:    Location:  Wrist   Wrist:  R radiocarpal Anesthesia:    Anesthesia method:  Local infiltration   Local anesthetic:  Lidocaine  1% w/o epi Procedure details:    Preparation: Patient was prepped and draped in usual sterile fashion     Needle gauge:  22 G   Aspirate amount:  0    Steroid injected: no   Post-procedure details:    Dressing:  Adhesive bandage Comments:     Attempted joint aspiration, unable to have any fluid return.     Medications Ordered in ED Medications  sodium chloride  0.9 % bolus 1,000 mL (0 mLs Intravenous Stopped 11/21/23 0247)  iohexol  (OMNIPAQUE ) 350 MG/ML injection 75 mL (75 mLs Intravenous Contrast Given 11/21/23 0205)  ketorolac  (TORADOL ) 15 MG/ML injection 15 mg (15 mg Intravenous Given 11/21/23 0524)  lidocaine  (PF) (XYLOCAINE ) 1 % injection 5 mL (5 mLs Infiltration Given 11/21/23 0534)    ED Course/ Medical Decision Making/ A&P                                 Medical Decision Making Amount and/or Complexity of Data Reviewed Labs: ordered. Radiology: ordered.  Risk Prescription drug management.   Patient with history of alcohol use disorder here for evaluation of vomiting diarrhea as well as pain throughout the right arm.  Patient tachycardic at time of ED arrival, improved prior to administration of fluids.  Patient does report that he has had some abdominal pain intermittently on the right side.  No current tenderness on examination.  He does have ecchymosis over his right chest wall, given his history of alcohol use and unclear if he had a fall trauma scans were obtained.  Trauma scans are negative for acute traumatic injury.  Doubt CVA.  CT does demonstrate cholelithiasis.  Given his elevated transaminases a right upper quadrant ultrasound was obtained.  Ultrasound does demonstrate cholelithiasis without any evidence of obstruction.  Suspect elevation in transaminases is secondary to alcohol use-last drink about 1 week ago.  Do not feel he has cholangitis.  He does not appear to be in active withdrawals.  He is able to tolerate oral fluids without difficulty in the emergency department.  In terms of his right upper extremity difficulties, he does have focal swelling over the right olecranon bursa with local tenderness as well as focal  swelling and tenderness over the right wrist as well as the right third MCP joint.  He does have some generalized discomfort as well throughout the arm on palpation.  He has decreased range of motion in the wrist, elbow, digits.  No cellulitis on exam.  Attempted joint aspiration of the wrist, which was unsuccessful.  In terms of his elbow he is unable to fully range this but his swelling and tenderness  is localized to the bursa, presentation is not consistent with septic elbow arthritis.  Given his polyarthralgia there is a low index of suspicion for septic joint at this time.  He does have elevated inflammatory markers, similar pattern when compared to prior in 22.  His uric acid is also elevated.  Unclear if this is gout with secondary polyarthralgia.  Will treat with NSAIDs.  Discussed with patient importance of very close PCP follow-up as well as return precautions for worsening or concerning symptoms.  Patient is in agreement with plan.  Patient currently without money to afford co-pays for medications-TOC consult placed.       Final Clinical Impression(s) / ED Diagnoses Final diagnoses:  Polyarthralgia  Vomiting and diarrhea  Elevated LFTs  Calculus of gallbladder without cholecystitis without obstruction    Rx / DC Orders ED Discharge Orders          Ordered    celecoxib  (CELEBREX ) 200 MG capsule  2 times daily with meals        11/21/23 0550    ondansetron  (ZOFRAN -ODT) 4 MG disintegrating tablet  Every 8 hours PRN        11/21/23 0550    pantoprazole  (PROTONIX ) 40 MG tablet  Daily        11/21/23 0550              Griselda Norris, MD 11/21/23 [id number]    Griselda Norris, MD 11/21/23 [phone]

## 2024-01-28 ENCOUNTER — Encounter (HOSPITAL_COMMUNITY): Payer: Self-pay

## 2024-01-28 ENCOUNTER — Other Ambulatory Visit: Payer: Self-pay

## 2024-01-28 ENCOUNTER — Emergency Department (HOSPITAL_COMMUNITY)
Admission: EM | Admit: 2024-01-28 | Discharge: 2024-01-29 | Discharge status: Against Medical Advice | Attending: Emergency Medicine | Admitting: Emergency Medicine

## 2024-01-28 DIAGNOSIS — Z5321 Procedure and treatment not carried out due to patient leaving prior to being seen by health care provider: Secondary | ICD-10-CM | POA: Insufficient documentation

## 2024-01-28 DIAGNOSIS — R21 Rash and other nonspecific skin eruption: Secondary | ICD-10-CM | POA: Diagnosis present

## 2024-01-28 NOTE — ED Triage Notes (Signed)
 Pt c/o rash on arms bilat and backx1-2wks. Pt states it itches. Pt has red dry, raised patches all over arms and back

## 2024-02-03 ENCOUNTER — Emergency Department (HOSPITAL_COMMUNITY)

## 2024-02-03 ENCOUNTER — Other Ambulatory Visit: Payer: Self-pay

## 2024-02-03 ENCOUNTER — Emergency Department (HOSPITAL_COMMUNITY)
Admission: EM | Admit: 2024-02-03 | Discharge: 2024-02-04 | Disposition: A | Discharge status: Home / Self Care | Attending: Emergency Medicine | Admitting: Emergency Medicine

## 2024-02-03 DIAGNOSIS — M25562 Pain in left knee: Secondary | ICD-10-CM | POA: Diagnosis present

## 2024-02-03 DIAGNOSIS — M25569 Pain in unspecified knee: Secondary | ICD-10-CM

## 2024-02-03 LAB — CBC WITH DIFFERENTIAL/PLATELET
Abs Immature Granulocytes: 0.03 10*3/uL (ref 0.00–0.07)
Basophils Absolute: 0 10*3/uL (ref 0.0–0.1)
Basophils Relative: 1 %
Eosinophils Absolute: 0.1 10*3/uL (ref 0.0–0.5)
Eosinophils Relative: 2 %
HCT: 44.1 % (ref 39.0–52.0)
Hemoglobin: 14.4 g/dL (ref 13.0–17.0)
Immature Granulocytes: 0 %
Lymphocytes Relative: 23 %
Lymphs Abs: 1.7 10*3/uL (ref 0.7–4.0)
MCH: 31.6 pg (ref 26.0–34.0)
MCHC: 32.7 g/dL (ref 30.0–36.0)
MCV: 96.7 fL (ref 80.0–100.0)
Monocytes Absolute: 0.6 10*3/uL (ref 0.1–1.0)
Monocytes Relative: 8 %
Neutro Abs: 5.1 10*3/uL (ref 1.7–7.7)
Neutrophils Relative %: 66 %
Platelets: 228 10*3/uL (ref 150–400)
RBC: 4.56 MIL/uL (ref 4.22–5.81)
RDW: 16.2 % — ABNORMAL HIGH (ref 11.5–15.5)
WBC: 7.6 10*3/uL (ref 4.0–10.5)
nRBC: 0 % (ref 0.0–0.2)

## 2024-02-03 LAB — ETHANOL: Alcohol, Ethyl (B): <15 mg/dL (ref <15)

## 2024-02-03 MED ORDER — SODIUM CHLORIDE 0.9 % IV BOLUS
500.0000 mL | Freq: Once | INTRAVENOUS | Status: AC
  Administered 2024-02-03: 500 mL via INTRAVENOUS

## 2024-02-03 NOTE — ED Triage Notes (Signed)
 Pt brought to ED from home for general weakness and knee pain. Pt states that he had a fall one week ago. Pt is able to ambulate. Home conditions are poor and patient has bed bugs.

## 2024-02-03 NOTE — ED Provider Notes (Signed)
 [place] EMERGENCY DEPARTMENT AT [facility] Provider Note   CSN: [id number] Arrival date & time: 02/03/24  1835     History  Chief Complaint  Patient presents with   Knee Pain    JAKEIM SEDORE is a 58 y.o. male.   Knee Pain Patient presents with weakness and knee pain.  Had fall a week or 2 ago.  Decreased oral intake because he states he cannot walk.  Severe left knee pain now.  States he really has not eaten much over the last week.  States he is only had 1 meal.  Does drink heavily.  Reportedly house was in disarray.    Past Medical History:  Diagnosis Date   Acute alcoholic hepatitis 03/2017   Acute hyponatremia    Alcoholism (HCC)    Cholecystitis    Gout    Liver failure (HCC) 03/2017    Home Medications Prior to Admission medications   Medication Sig Start Date End Date Taking? Authorizing Provider  celecoxib  (CELEBREX ) 200 MG capsule Take 1 capsule (200 mg total) by mouth 2 (two) times daily with a meal. 11/21/23   Kelsey Patricia, MD  colchicine  0.6 MG tablet Take 1 tablet (0.6 mg total) by mouth daily. 10/18/21   Lawrance Presume, MD  dexamethasone  (DECADRON ) 1 MG tablet Take 1 tablet by mouth at 11 p.m then come to lab next morning at 8:30 a.m to have Cortisol level checked 10/18/21   Lawrance Presume, MD  diclofenac  Sodium (VOLTAREN ) 1 % GEL Apply 4 g topically 4 (four) times daily. 08/13/21   Ghimire, Estil Heman, MD  doxycycline  (VIBRAMYCIN ) 100 MG capsule Take 1 capsule (100 mg total) by mouth 2 (two) times daily. 06/08/23   Elisa Guest, PA-C  gabapentin  (NEURONTIN ) 400 MG capsule Take 1 capsule (400 mg total) by mouth 3 (three) times daily. 09/04/21 12/03/21  Lawrance Presume, MD  meloxicam  (MOBIC ) 15 MG tablet Take 1 tablet (15 mg total) by mouth daily. 10/18/21   Lawrance Presume, MD  ondansetron  (ZOFRAN -ODT) 4 MG disintegrating tablet Take 1 tablet (4 mg total) by mouth every 8 (eight) hours as needed. 11/21/23   Kelsey Patricia, MD   pantoprazole  (PROTONIX ) 40 MG tablet Take 1 tablet (40 mg total) by mouth daily. 11/21/23   Kelsey Patricia, MD  polyethylene glycol powder (GLYCOLAX /MIRALAX ) 17 GM/SCOOP powder Take 17 g by mouth daily. For constipation 09/04/21   Lawrance Presume, MD  vitamin B-12 (CYANOCOBALAMIN ) 1000 MCG tablet Take 1 tablet (1,000 mcg total) by mouth daily. 10/18/21   Lawrance Presume, MD      Allergies    Patient has no known allergies.    Review of Systems   Review of Systems  Physical Exam Updated Vital Signs BP 132/70   Pulse (!) 58   Temp 98.8 F (37.1 C) (Oral)   Resp 18   SpO2 97%  Physical Exam Vitals and nursing note reviewed.  Constitutional:      Appearance: He is ill-appearing.  Cardiovascular:     Rate and Rhythm: Regular rhythm.  Chest:     Chest wall: No tenderness.  Abdominal:     Tenderness: There is no abdominal tenderness.  Musculoskeletal:        General: Tenderness present.     Comments: Tenderness to knee.  Does have effusion.  Decreased range of motion on left knee.  Does have abrasion on lateral aspect of the knee.  Skin:    Capillary Refill: Capillary  refill takes less than 2 seconds.  Neurological:     Mental Status: He is alert. Mental status is at baseline.     ED Results / Procedures / Treatments   Labs (all labs ordered are listed, but only abnormal results are displayed) Labs Reviewed  COMPREHENSIVE METABOLIC PANEL WITH GFR  ETHANOL  CBC WITH DIFFERENTIAL/PLATELET  AMMONIA  MAGNESIUM    EKG None  Radiology DG Knee Left Port Result Date: 02/03/2024 CLINICAL DATA:  Fall, generalized weakness EXAM: PORTABLE LEFT KNEE - 1-2 VIEW COMPARISON:  08/09/2021 FINDINGS: AP exam interpretation is compromised due to overlapping osseous structures as the patient is unable to straighten leg. No definite fracture. Bi or tripartite patella. Small knee joint effusion. Soft tissue swelling about the knee. IMPRESSION: No definite fracture. Small knee joint  effusion. Electronically Signed   By: Rozell Cornet M.D.   On: 02/03/2024 20:49   DG Chest Portable 1 View Result Date: 02/03/2024 CLINICAL DATA:  Weakness EXAM: PORTABLE CHEST 1 VIEW COMPARISON:  11/21/2023. FINDINGS: The heart size and mediastinal contours are within normal limits. Minimal basilar subsegmental atelectasis or consolidation at the left base. Lungs otherwise clear. No pneumothorax. There may be small left-sided pleural effusion. IMPRESSION: Left basilar atelectasis or consolidation and possible small effusion. Electronically Signed   By: Sydell Eva M.D.   On: 02/03/2024 20:34    Procedures Procedures    Medications Ordered in ED Medications  sodium chloride  0.9 % bolus 500 mL (has no administration in time range)    ED Course/ Medical Decision Making/ A&P                                 Medical Decision Making Amount and/or Complexity of Data Reviewed Labs: ordered. Radiology: ordered.   Patient with generalized weakness.  Decreased oral intake.  Reportedly does drink heavily.  Did have recent fall.  X-ray did not show fracture on the knee but is largely swollen.  Does have history of gout also.  Differential diagnosis does include cause such as dehydration, hyponatremia which she has had previously.  Blood work pending.  Reviewed recent ER visit.  Does have previous visit with severe malnutrition.  Does look worse than his picture in his chart although that was from 9 years ago.  Care will be turned over to [facility]        Final Clinical Impression(s) / ED Diagnoses Final diagnoses:  None    Rx / DC Orders ED Discharge Orders     None         Mozell Arias, MD 02/03/24 2256

## 2024-02-04 LAB — COMPREHENSIVE METABOLIC PANEL WITH GFR
ALT: 56 U/L — ABNORMAL HIGH (ref 0–44)
AST: 62 U/L — ABNORMAL HIGH (ref 15–41)
Albumin: 3.2 g/dL — ABNORMAL LOW (ref 3.5–5.0)
Alkaline Phosphatase: 131 U/L — ABNORMAL HIGH (ref 38–126)
Anion gap: 11 (ref 5–15)
BUN: 15 mg/dL (ref 6–20)
CO2: 21 mmol/L — ABNORMAL LOW (ref 22–32)
Calcium: 9.1 mg/dL (ref 8.9–10.3)
Chloride: 108 mmol/L (ref 98–111)
Creatinine, Ser: 0.72 mg/dL (ref 0.61–1.24)
GFR, Estimated: >60 mL/min (ref >60)
Glucose, Bld: 109 mg/dL — ABNORMAL HIGH (ref 70–99)
Potassium: 3.9 mmol/L (ref 3.5–5.1)
Sodium: 140 mmol/L (ref 135–145)
Total Bilirubin: 2.5 mg/dL — ABNORMAL HIGH (ref 0.0–1.2)
Total Protein: 6.9 g/dL (ref 6.5–8.1)

## 2024-02-04 LAB — AMMONIA: Ammonia: 27 umol/L (ref 9–35)

## 2024-02-04 LAB — MAGNESIUM: Magnesium: 2.4 mg/dL (ref 1.7–2.4)

## 2024-02-04 MED ORDER — KETOROLAC TROMETHAMINE 15 MG/ML IJ SOLN
15.0000 mg | Freq: Once | INTRAMUSCULAR | Status: AC
  Administered 2024-02-04: 15 mg via INTRAVENOUS
  Filled 2024-02-04: qty 1

## 2024-02-04 MED ORDER — ACETAMINOPHEN 325 MG PO TABS
650.0000 mg | ORAL_TABLET | Freq: Once | ORAL | Status: AC
  Administered 2024-02-04: 650 mg via ORAL
  Filled 2024-02-04: qty 2

## 2024-02-04 MED ORDER — DICLOFENAC SODIUM 1 % EX GEL: 4.0000 g | Freq: Four times a day (QID) | CUTANEOUS | Status: DC

## 2024-02-04 MED ORDER — LISINOPRIL 10 MG PO TABS: 10.0000 mg | ORAL_TABLET | Freq: Every day | ORAL | Status: DC

## 2024-02-04 MED ORDER — VITAMIN B-12 1000 MCG PO TABS: 1000.0000 ug | ORAL_TABLET | Freq: Every day | ORAL | Status: DC

## 2024-02-04 NOTE — Discharge Instructions (Addendum)
 The x-rays did not show any signs of serious injury.  X-rays however will miss injuries to the cartilage as well as the ligaments.  Consider following up with orthopedic doctor for further evaluation.  Use the crutches to help decrease the weight on your knee.  You can take over-the-counter medications such as Tylenol  ibuprofen  as needed for pain

## 2024-02-04 NOTE — Progress Notes (Signed)
 Per PT, they will not be seeing patient at this time due bed bug infestation. TOC following for eval

## 2024-02-04 NOTE — ED Notes (Signed)
 Pt up walking in hall on crutches.

## 2024-02-04 NOTE — ED Provider Notes (Signed)
 I was notified that PT did evaluate the patient.  He was able to ambulate with crutches.  Patient is requesting discharge.  Will discharge home with recommendations to follow-up with orthopedics as an outpatient.   Trish Furl, MD 02/04/24 [phone]

## 2024-02-04 NOTE — Progress Notes (Signed)
 Per PT, pt is able to ambulate with crutches and there are no PT needs. He will be discharged.

## 2024-02-04 NOTE — ED Provider Notes (Signed)
 I assumed care of this patient from previous provider.  Please see their note for further details of history, exam, and MDM.    Pending labs to assess for any significant electrolyte or metabolic derangements.  Labs reassuring.  Patient provided with IV and oral pain medicine.  Brace and crutches provided.  Patient is unable to stand even with multiperson assistance.  Will consult PT OT.  TOC consultation.  Home meds ordered.      Lindle Rhea, MD 02/04/24 [phone]

## 2024-02-04 NOTE — Evaluation (Signed)
 Physical Therapy Evaluation Patient Details Name: William [name] MRN: [id number] DOB: 1966/07/29 Today's Date: 02/04/2024  History of Present Illness  58 yo male brought to ED 02/03/24 after fall and reports  Left knee injury. X-ray did not show fracture on the knee but is largely swollen.  PMH: gout, ETOH, liver failure  Clinical Impression  The patient received seated on bed, asking for a Wc so he can leave. Patient instructed in proper use of crutches,  to remove crutches from under arms prior to sitting.  Patient instructed in use of left knee brace, to tighten  lower strap first and make it snug, then tighten top strap. Patient ambulated in the room  with crutches , patient is modified independent.      If plan is discharge home, recommend the following: Help with stairs or ramp for entrance   Can travel by private vehicle        Equipment Recommendations Crutches  Recommendations for Other Services       Functional Status Assessment Patient has had a recent decline in their functional status and/or demonstrates limited ability to make significant improvements in function in a reasonable and predictable amount of time     Precautions / Restrictions Precautions Precautions: Knee Required Braces or Orthoses: Other Brace Other Brace: neoprene sleeve to  left knee Restrictions Weight Bearing Restrictions Per Provider Order: No      Mobility  Bed Mobility Overal bed mobility: Independent                  Transfers Overall transfer level: Modified independent Equipment used: Crutches                    Ambulation/Gait Ambulation/Gait assistance: Modified independent (Device/Increase time) Gait Distance (Feet): 20 Feet Assistive device: Crutches         General Gait Details: instructed  to remove crutches from under arms prior to sitting down  Stairs            Wheelchair Mobility     Tilt Bed    Modified Rankin (Stroke Patients Only)        Balance Overall balance assessment: Mild deficits observed, not formally tested                                           Pertinent Vitals/Pain Pain Assessment Pain Assessment: No/denies pain    Home Living Family/patient expects to be discharged to:: Private residence Living Arrangements: Alone   Type of Home: Mobile home Home Access: Stairs to enter   [employer] of Steps: afew-I will get someone to help me   Home Layout: One level        Prior Function Prior Level of Function : Independent/Modified Independent             Mobility Comments: has crutches       Extremity/Trunk Assessment   Upper Extremity Assessment Upper Extremity Assessment: Overall WFL for tasks assessed    Lower Extremity Assessment Lower Extremity Assessment: LLE deficits/detail LLE Deficits / Details: knee brace in place    Cervical / Trunk Assessment Cervical / Trunk Assessment: Normal  Communication   Communication Communication: No apparent difficulties    Cognition Arousal: Alert Behavior During Therapy: Impulsive   PT - Cognitive impairments: Difficult to assess  PT - Cognition Comments: states he needs a WC  so he  can leave Following commands: Intact       Cueing Cueing Techniques: Verbal cues     General Comments      Exercises     Assessment/Plan    PT Assessment Patient does not need any further PT services  PT Problem List         PT Treatment Interventions      PT Goals (Current goals can be found in the Care Plan section)  Acute Rehab PT Goals Patient Stated Goal: go home PT Goal Formulation: All assessment and education complete, DC therapy    Frequency       Co-evaluation               AM-PAC PT "6 Clicks" Mobility  Outcome Measure Help needed turning from your back to your side while in a flat bed without using bedrails?: None Help needed moving from lying on your  back to sitting on the side of a flat bed without using bedrails?: None Help needed moving to and from a bed to a chair (including a wheelchair)?: None Help needed standing up from a chair using your arms (e.g., wheelchair or bedside chair)?: None Help needed to walk in hospital room?: None Help needed climbing 3-5 steps with a railing? : A Little 6 Click Score: 23    End of Session   Activity Tolerance: Patient tolerated treatment well Patient left: with call bell/phone within reach;in bed Nurse Communication: Mobility status PT Visit Diagnosis: Unsteadiness on feet (R26.81)    Time: 1050-1108 PT Time Calculation (min) (ACUTE ONLY): 18 min   Charges:   PT Evaluation $PT Eval Low Complexity: 1 Low   PT General Charges $$ ACUTE PT VISIT: 1 Visit         Abelina Hoes PT Acute Rehabilitation Services Office [phone] Weekend [phone]   Dareen Ebbing 02/04/2024, 11:26 AM

## 2024-06-10 NOTE — Telephone Encounter (Signed)
 Patient being referred for alcohol associated liver disease (ALD) elevated liver enzymes hepatitis C  Please schedule patient for next available non urgent new patient visit.  Referring: Lovie Bough, NP [facility] [address] [place], [place] [address] [phone] Main Line [phone] Fax

## 2024-06-15 NOTE — Telephone Encounter (Signed)
 NP appt scheduled for 10/22/2024 at 10:00 AM with a 9:30 AM arrival time. Confirmed address. NP packet mailed. Informed referring.

## 2024-06-15 NOTE — Telephone Encounter (Signed)
Pt is returning call to schedule NP appt

## 2024-10-14 ENCOUNTER — Emergency Department (HOSPITAL_COMMUNITY)
Admission: EM | Admit: 2024-10-14 | Discharge: 2024-10-14 | Disposition: A | Discharge status: Home / Self Care | Source: Home / Self Care | Attending: Emergency Medicine | Admitting: Emergency Medicine

## 2024-10-14 ENCOUNTER — Emergency Department (HOSPITAL_COMMUNITY)

## 2024-10-14 DIAGNOSIS — M25461 Effusion, right knee: Secondary | ICD-10-CM | POA: Diagnosis not present

## 2024-10-14 DIAGNOSIS — R21 Rash and other nonspecific skin eruption: Secondary | ICD-10-CM | POA: Diagnosis not present

## 2024-10-14 DIAGNOSIS — G8929 Other chronic pain: Secondary | ICD-10-CM

## 2024-10-14 DIAGNOSIS — M25561 Pain in right knee: Secondary | ICD-10-CM | POA: Diagnosis present

## 2024-10-14 LAB — CBC WITH DIFFERENTIAL/PLATELET
Abs Immature Granulocytes: 0.03 K/uL (ref 0.00–0.07)
Basophils Absolute: 0 K/uL (ref 0.0–0.1)
Basophils Relative: 0 %
Eosinophils Absolute: 0.1 K/uL (ref 0.0–0.5)
Eosinophils Relative: 1 %
HCT: 46.7 % (ref 39.0–52.0)
Hemoglobin: 15.5 g/dL (ref 13.0–17.0)
Immature Granulocytes: 0 %
Lymphocytes Relative: 19 %
Lymphs Abs: 1.4 K/uL (ref 0.7–4.0)
MCH: 33.1 pg (ref 26.0–34.0)
MCHC: 33.2 g/dL (ref 30.0–36.0)
MCV: 99.8 fL (ref 80.0–100.0)
Monocytes Absolute: 0.8 K/uL (ref 0.1–1.0)
Monocytes Relative: 10 %
Neutro Abs: 5.1 K/uL (ref 1.7–7.7)
Neutrophils Relative %: 70 %
Platelets: 213 K/uL (ref 150–400)
RBC: 4.68 MIL/uL (ref 4.22–5.81)
RDW: 14.8 % (ref 11.5–15.5)
WBC: 7.4 K/uL (ref 4.0–10.5)
nRBC: 0 % (ref 0.0–0.2)

## 2024-10-14 LAB — SYNOVIAL CELL COUNT + DIFF, W/ CRYSTALS
Eosinophils-Synovial: 0 % (ref 0–1)
Lymphocytes-Synovial Fld: 1 % (ref 0–20)
Monocyte-Macrophage-Synovial Fluid: 8 % — ABNORMAL LOW (ref 50–90)
Neutrophil, Synovial: 91 % — ABNORMAL HIGH (ref 0–25)
WBC, Synovial: 580 /mm3 — ABNORMAL HIGH (ref 0–200)

## 2024-10-14 LAB — COMPREHENSIVE METABOLIC PANEL WITH GFR
ALT: 99 U/L — ABNORMAL HIGH (ref 0–44)
AST: 101 U/L — ABNORMAL HIGH (ref 15–41)
Albumin: 4 g/dL (ref 3.5–5.0)
Alkaline Phosphatase: 240 U/L — ABNORMAL HIGH (ref 38–126)
Anion gap: 12 (ref 5–15)
BUN: 17 mg/dL (ref 6–20)
CO2: 25 mmol/L (ref 22–32)
Calcium: 10.3 mg/dL (ref 8.9–10.3)
Chloride: 104 mmol/L (ref 98–111)
Creatinine, Ser: 0.92 mg/dL (ref 0.61–1.24)
GFR, Estimated: >60 mL/min
Glucose, Bld: 108 mg/dL — ABNORMAL HIGH (ref 70–99)
Potassium: 4.4 mmol/L (ref 3.5–5.1)
Sodium: 141 mmol/L (ref 135–145)
Total Bilirubin: 2.8 mg/dL — ABNORMAL HIGH (ref 0.0–1.2)
Total Protein: 7.9 g/dL (ref 6.5–8.1)

## 2024-10-14 LAB — URIC ACID: Uric Acid, Serum: 6.2 mg/dL (ref 3.7–8.6)

## 2024-10-14 LAB — CBG MONITORING, ED: Glucose-Capillary: 77 mg/dL (ref 70–99)

## 2024-10-14 LAB — CK: Total CK: 122 U/L (ref 49–397)

## 2024-10-14 MED ORDER — TRIAMCINOLONE ACETONIDE 0.1 % EX CREA: 1.0000 | TOPICAL_CREAM | Freq: Two times a day (BID) | CUTANEOUS | 0 refills | Status: AC

## 2024-10-14 MED ORDER — COLCHICINE 0.6 MG PO TABS: 0.6000 mg | ORAL_TABLET | Freq: Every day | ORAL | 0 refills | Status: AC

## 2024-10-14 MED ORDER — OXYCODONE-ACETAMINOPHEN 5-325 MG PO TABS
1.0000 | ORAL_TABLET | Freq: Once | ORAL | Status: AC
  Administered 2024-10-14: 1 via ORAL
  Filled 2024-10-14: qty 1

## 2024-10-14 MED ORDER — CELECOXIB 200 MG PO CAPS: 200.0000 mg | ORAL_CAPSULE | Freq: Two times a day (BID) | ORAL | 1 refills | Status: AC

## 2024-10-14 MED ORDER — LIDOCAINE HCL (PF) 1 % IJ SOLN
2.0000 mL | INTRAMUSCULAR | Status: DC
  Administered 2024-10-14: 2 mL via INTRADERMAL

## 2024-10-14 MED ORDER — LIDOCAINE HCL (PF) 1 % IJ SOLN
5.0000 mL | Freq: Once | INTRAMUSCULAR | Status: AC
  Administered 2024-10-14: 5 mL
  Filled 2024-10-14: qty 30

## 2024-10-14 NOTE — ED Provider Notes (Signed)
 " [place] EMERGENCY DEPARTMENT AT [facility] Provider Note   CSN: [id number] Arrival date & time: 10/14/24  1209     Patient presents with: No chief complaint on file.   William [name] is a 59 y.o. male.   Patient is a 59 year old male with a past medical history of alcohol use disorder and gout presenting to the emergency department with right knee pain.  The patient states he has had significant pain and swelling in his right knee for the last week.  He states that he has been unable to get out of bed due to the pain.  He states that he has been hardly eating and drinking since he has not been able to get himself to the bathroom or to the kitchen.  He states he has not taken anything for pain.  He states that he has had gout in his right knee in the past.  Patient states that his left shoulder has also felt sore.  He denies any trauma or falls, repetitive lifting or activity.  He denies any fever.  He also reports that he has had a rash on his bilateral feet for about the last 3 weeks.  He states that he was prescribed a cream by his primary doctor but has had no improvement. States his last drink was one week ago.   The history is provided by the patient.       Prior to Admission medications  Medication Sig Start Date End Date Taking? Authorizing Provider  celecoxib  (CELEBREX ) 200 MG capsule Take 1 capsule (200 mg total) by mouth 2 (two) times daily with a meal. 11/21/23   Griselda Norris, MD  colchicine  0.6 MG tablet Take 1 tablet (0.6 mg total) by mouth daily. 10/18/21   Vicci Barnie NOVAK, MD  dexamethasone  (DECADRON ) 1 MG tablet Take 1 tablet by mouth at 11 p.m then come to lab next morning at 8:30 a.m to have Cortisol level checked 10/18/21   Vicci Barnie NOVAK, MD  diclofenac  Sodium (VOLTAREN ) 1 % GEL Apply 4 g topically 4 (four) times daily. 08/13/21   Ghimire, Donalda HERO, MD  doxycycline  (VIBRAMYCIN ) 100 MG capsule Take 1 capsule (100 mg total) by mouth 2 (two) times daily.  06/08/23   Logan Ubaldo NOVAK, PA-C  gabapentin  (NEURONTIN ) 300 MG capsule Take 300 mg by mouth 2 (two) times daily. 01/07/24   [provider]  gabapentin  (NEURONTIN ) 400 MG capsule Take 1 capsule (400 mg total) by mouth 3 (three) times daily. 09/04/21 12/03/21  Vicci Barnie NOVAK, MD  lisinopril  (ZESTRIL ) 10 MG tablet Take 10 mg by mouth daily. 09/15/23   [provider]  meloxicam  (MOBIC ) 15 MG tablet Take 1 tablet (15 mg total) by mouth daily. 10/18/21   Vicci Barnie NOVAK, MD  methylPREDNISolone (MEDROL DOSEPAK) 4 MG TBPK tablet Take by mouth as directed. 01/07/24   [provider]  ondansetron  (ZOFRAN -ODT) 4 MG disintegrating tablet Take 1 tablet (4 mg total) by mouth every 8 (eight) hours as needed. 11/21/23   Griselda Norris, MD  pantoprazole  (PROTONIX ) 40 MG tablet Take 1 tablet (40 mg total) by mouth daily. 11/21/23   Griselda Norris, MD  polyethylene glycol powder (GLYCOLAX /MIRALAX ) 17 GM/SCOOP powder Take 17 g by mouth daily. For constipation 09/04/21   Vicci Barnie NOVAK, MD  triamcinolone (KENALOG) 0.025 % cream Apply 1 Application topically daily as needed. 01/07/24   [provider]  vitamin B-12 (CYANOCOBALAMIN ) 1000 MCG tablet Take 1 tablet (1,000 mcg total)  by mouth daily. 10/18/21   Vicci Barnie NOVAK, MD    Allergies: Patient has no known allergies.    Review of Systems  Updated Vital Signs BP 132/86 (BP Location: Left Arm)   Pulse 62   Temp 97.6 F (36.4 C) (Oral)   Resp 18   SpO2 97%   Physical Exam Vitals and nursing note reviewed.  Constitutional:      General: He is not in acute distress.    Appearance: Normal appearance. He is ill-appearing (chronically).  HENT:     Head: Normocephalic and atraumatic.     Nose: Nose normal.     Mouth/Throat:     Mouth: Mucous membranes are moist.     Pharynx: Oropharynx is clear.  Eyes:     Extraocular Movements: Extraocular movements intact.     Conjunctiva/sclera: Conjunctivae normal.   Cardiovascular:     Rate and Rhythm: Normal rate and regular rhythm.     Pulses: Normal pulses.     Heart sounds: Normal heart sounds.  Pulmonary:     Effort: Pulmonary effort is normal.     Breath sounds: Normal breath sounds.  Abdominal:     General: Abdomen is flat.     Palpations: Abdomen is soft.     Tenderness: There is no abdominal tenderness.  Musculoskeletal:     Cervical back: Normal range of motion.     Comments: No bony tenderness to LUE, ROM intact Tenderness to palpation of R knee with palpable knee joint effusion, mild warmth to touch, no erythema, knee held in flexed position, patient unable to extend knee passively or actively 2/2 pain  Skin:    General: Skin is warm and dry.     Findings: Rash (dry scabbed rash to dorsum of L foot) present.  Neurological:     General: No focal deficit present.     Mental Status: He is alert and oriented to person, place, and time.  Psychiatric:        Mood and Affect: Mood normal.        Behavior: Behavior normal.     (all labs ordered are listed, but only abnormal results are displayed) Labs Reviewed  COMPREHENSIVE METABOLIC PANEL WITH GFR - Abnormal; Notable for the following components:      Result Value   Glucose, Bld 108 (*)    AST 101 (*)    ALT 99 (*)    Alkaline Phosphatase 240 (*)    Total Bilirubin 2.8 (*)    All other components within normal limits  BODY FLUID CULTURE W GRAM STAIN  CBC WITH DIFFERENTIAL/PLATELET  URIC ACID  CK  GLUCOSE, BODY FLUID OTHER            PROTEIN, BODY FLUID (OTHER)  SYNOVIAL CELL COUNT + DIFF, W/ CRYSTALS  CBG MONITORING, ED    EKG: None  Radiology: DG Knee Complete 4 Views Right Result Date: 10/14/2024 CLINICAL DATA:  Right knee pain and swelling. Clinical concern for gout. EXAM: RIGHT KNEE - COMPLETE 4+ VIEW COMPARISON:  08/09/2021. FINDINGS: There is no evidence of acute fracture or dislocation. A moderate suprapatellar joint effusion is seen. There are moderate to severe  tricompartmental degenerative changes. A stable loose body is noted in the suprapatellar region. IMPRESSION: 1. No acute fracture or dislocation. 2. Moderate joint effusion. 3. 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 4. 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 Electronically Signed   By: Leita Birmingham M.D.   On: 10/14/2024 13:48     .  Joint Aspiration/Arthrocentesis: R knee  Date/Time: 10/14/2024 2:52 PM  Performed by: Ellouise Richerd POUR, DO Authorized by: Ellouise Richerd POUR, DO   Consent:    Consent obtained:  Verbal   Consent given by:  Patient   Risks, benefits, and alternatives were discussed: yes     Risks discussed:  Bleeding, infection, pain and incomplete drainage   Alternatives discussed:  No treatment and delayed treatment Universal protocol:    Procedure explained and questions answered to patient or proxy's satisfaction: yes     Patient identity confirmed:  Verbally with patient Location:    Location:  Knee   Knee:  R knee Anesthesia:    Anesthesia method:  Local infiltration   Local anesthetic:  2 mL lidocaine  (PF) 1 % Procedure details:    Preparation: Patient was prepped and draped in usual sterile fashion     Needle gauge:  18 G   Ultrasound guidance: no     Approach:  Medial   Aspirate amount:  20 mL   Aspirate characteristics:  Serous, yellow and clear   Steroid injected: no     Specimen collected: yes   Post-procedure details:    Dressing:  Adhesive bandage   Procedure completion:  Tolerated well, no immediate complications    Medications Ordered in the ED  oxyCODONE -acetaminophen  (PERCOCET/ROXICET) 5-325 MG per tablet 1 tablet (1 tablet Oral Given 10/14/24 1324)  lidocaine  (PF) (XYLOCAINE ) 1 % injection 5 mL (5 mLs Other Given by Other 10/14/24 1437)    Clinical Course as of 10/14/24 1507  Thu Oct 14, 2024  1416 XR with moderate knee joint effusion and arthritic changes. Patient  recommended arthrocentesis to r/o septic joint. Transaminitis and elevated bili at baseline. Normal uric acid.  [VK]  1507 Patient signed out to Dr. Laurice pending arthrocentesis fluid studies. [VK]    Clinical Course User Index [VK] Kingsley, Amarionna Arca K, DO                                 Medical Decision Making This patient presents to the ED with chief complaint(s) of R knee pain with pertinent past medical history of ETOH use disorder, gout which further complicates the presenting complaint. The complaint involves an extensive differential diagnosis and also carries with it a high risk of complications and morbidity.    The differential diagnosis includes gout, considering septic joint with decreased ROM, bony or ligamentous injury less likely as no significant trauma, dehydration, electrolyte abnormality, rhabdo  Additional history obtained: Additional history obtained from N/A Records reviewed multiple previous ED records for joint pains  ED Course and Reassessment: On patient's arrival he is hemodynamically stable in no acute distress with normal glucose.  Patient will have labs and x-ray of his right knee.  May need arthrocentesis of his knee.  He is given pain control and will be closely reassessed.  Independent labs interpretation:  The following labs were independently interpreted: within normal range  Independent visualization of imaging: - I independently visualized the following imaging with scope of interpretation limited to determining acute life threatening conditions related to emergency care: R knee XR, which revealed moderate knee joint effusion, arthritic changes    Amount and/or Complexity of Data Reviewed Labs: ordered. Radiology: ordered.  Risk Prescription drug management.       Final diagnoses:  None    ED Discharge Orders     None  Kingsley, Bartow Zylstra K, DO 10/14/24 1508  "

## 2024-10-14 NOTE — ED Provider Notes (Signed)
 Patient seen after prior ED provider.  Patient presented with complaint of right knee pain.  There was a concern for possible septic arthritis.  Synovial fluid was sent for evaluation.  Synovial fluid analysis is not complete.  Patient is no longer interested in waiting in the ED for completion of his workup.  He desires discharge.  He understands the risk of leaving prior to completing workup.  Patient given instructions to follow-up closely with orthopedics.  Patient is advised to return to the ED if his pain worsens or if he develops a fever.  Importance of close follow-up is stressed.  Strict return precautions given understood.     Laurice Maude BROCKS, MD 10/14/24 [phone]

## 2024-10-14 NOTE — Discharge Instructions (Addendum)
 Return for any problem.   Follow up with Orthopedics ( Dr. Genelle) as instructed.   Take Colchicine  and Celebrex  for pain. Use Kenalog ointment as instructed.

## 2024-10-14 NOTE — ED Triage Notes (Signed)
 Bib ems Pain in ble r/t gout No recent falls Ems says FTT x 1 week VS WNL w/ EMS  Hasn't gotten off couch in 1 week due to pain

## 2024-10-14 NOTE — ED Notes (Signed)
 EDP at [facility], discussing arthocentesis of R knee

## 2024-10-17 LAB — GLUCOSE, BODY FLUID OTHER: Glucose, Body Fluid Other: 42 mg/dL

## 2024-10-17 LAB — PROTEIN, BODY FLUID (OTHER): Total Protein, Body Fluid Other: 4.4 g/dL

## 2024-10-18 LAB — BODY FLUID CULTURE W GRAM STAIN: Culture: NO GROWTH
